# Patient Record
Sex: Female | Born: 1980 | Race: White | Hispanic: No | Marital: Married | Smoking: Former smoker
Health system: Southern US, Community
[De-identification: ages and names within clinical notes are randomized; demographics above are authoritative.]

## PROBLEM LIST (undated history)

## (undated) DIAGNOSIS — K219 Gastro-esophageal reflux disease without esophagitis: Secondary | ICD-10-CM

## (undated) DIAGNOSIS — O1490 Unspecified pre-eclampsia, unspecified trimester: Secondary | ICD-10-CM

## (undated) DIAGNOSIS — R002 Palpitations: Secondary | ICD-10-CM

## (undated) DIAGNOSIS — D126 Benign neoplasm of colon, unspecified: Secondary | ICD-10-CM

## (undated) DIAGNOSIS — M1990 Unspecified osteoarthritis, unspecified site: Secondary | ICD-10-CM

## (undated) DIAGNOSIS — K259 Gastric ulcer, unspecified as acute or chronic, without hemorrhage or perforation: Secondary | ICD-10-CM

## (undated) DIAGNOSIS — E042 Nontoxic multinodular goiter: Secondary | ICD-10-CM

## (undated) DIAGNOSIS — K760 Fatty (change of) liver, not elsewhere classified: Secondary | ICD-10-CM

## (undated) HISTORY — DX: Nontoxic multinodular goiter: E04.2

## (undated) HISTORY — DX: Unspecified osteoarthritis, unspecified site: M19.90

## (undated) HISTORY — PX: TUBAL LIGATION: SHX77

## (undated) HISTORY — PX: CHOLECYSTECTOMY: SHX55

## (undated) HISTORY — PX: LIVER BIOPSY: SHX301

## (undated) HISTORY — DX: Benign neoplasm of colon, unspecified: D12.6

## (undated) HISTORY — DX: Gastro-esophageal reflux disease without esophagitis: K21.9

## (undated) HISTORY — DX: Unspecified pre-eclampsia, unspecified trimester: O14.90

## (undated) HISTORY — DX: Palpitations: R00.2

---

## 2011-12-10 HISTORY — PX: MOUTH SURGERY: SHX715

## 2015-10-30 LAB — HEPATIC FUNCTION PANEL
ALT: 71 U/L — AB (ref 7–35)
AST: 38 U/L — AB (ref 13–35)
Alkaline Phosphatase: 85 U/L (ref 25–125)
Bilirubin, Total: 0.3 mg/dL

## 2015-10-30 LAB — CBC AND DIFFERENTIAL
HEMATOCRIT: 36 % (ref 36–46)
HEMOGLOBIN: 11.6 g/dL — AB (ref 12.0–16.0)
Neutrophils Absolute: 61 /uL
Platelets: 320 10*3/uL (ref 150–399)
WBC: 8.1 10*3/mL

## 2015-10-30 LAB — LIPID PANEL
CHOLESTEROL: 208 mg/dL — AB (ref 0–200)
HDL: 65 mg/dL (ref 35–70)
LDL CALC: 128 mg/dL
TRIGLYCERIDES: 76 mg/dL (ref 40–160)

## 2015-10-30 LAB — BASIC METABOLIC PANEL
BUN: 8 mg/dL (ref 4–21)
CREATININE: 0.6 mg/dL (ref 0.5–1.1)
Glucose: 103 mg/dL
POTASSIUM: 4 mmol/L (ref 3.4–5.3)
SODIUM: 141 mmol/L (ref 137–147)

## 2015-10-30 LAB — TSH: TSH: 1.34 u[IU]/mL (ref 0.41–5.90)

## 2015-12-22 ENCOUNTER — Encounter: Payer: Self-pay | Admitting: *Deleted

## 2015-12-22 ENCOUNTER — Emergency Department (INDEPENDENT_AMBULATORY_CARE_PROVIDER_SITE_OTHER)
Admission: EM | Admit: 2015-12-22 | Discharge: 2015-12-22 | Disposition: A | Discharge status: Home / Self Care | Payer: BLUE CROSS/BLUE SHIELD | Source: Home / Self Care | Attending: Family Medicine | Admitting: Family Medicine

## 2015-12-22 DIAGNOSIS — R112 Nausea with vomiting, unspecified: Secondary | ICD-10-CM

## 2015-12-22 DIAGNOSIS — R079 Chest pain, unspecified: Secondary | ICD-10-CM | POA: Diagnosis not present

## 2015-12-22 DIAGNOSIS — R1013 Epigastric pain: Secondary | ICD-10-CM | POA: Diagnosis not present

## 2015-12-22 HISTORY — DX: Fatty (change of) liver, not elsewhere classified: K76.0

## 2015-12-22 NOTE — ED Provider Notes (Signed)
CSN: [id number]     Arrival date & time 12/22/15  2013 History   First MD Initiated Contact with Patient 12/22/15 2026     Chief Complaint  Patient presents with  . Hypertension  . Nausea   (Consider location/radiation/quality/duration/timing/severity/associated sxs/prior Treatment) HPI  Pt is a 35yo female presenting to [facility] with c/o feeling flushed, chest feels hot, Right sided chest/rib pain, nausea and epigastric pain intermittent for 1 week. Symptoms worsened earlier today. Pt states she was standing with her daughter earlier today and felt like she was "off balance." she also reports intermittent Right hand numbness.  Around 6PM she became so nauseated she caused herself to vomit but states she did not feel any better. She did go to urgent care yesterday and had a normal EKG and was dx with sinusitis and given Augmentin.  She states symptoms are no better today.  She also reports wearing a Holter monitor for palpitations about 1 month ago.  The findings were normal.  Denies prior abdominal surgeries or heart issues. Denies SOB at this time. Pt unsure if these symptoms are due to a panic attack but she does not take any medications for blood pressure, her palpitations or anxiety.   Past Medical History  Diagnosis Date  . Fatty liver   . Esophagitis    History reviewed. No pertinent past surgical history. Family History  Problem Relation Age of Onset  . Cancer Mother     colon  . Diabetes Father   . Multiple sclerosis Sister    Social History  Substance Use Topics  . Smoking status: Never Smoker   . Smokeless tobacco: None  . Alcohol Use: No   OB History    No data available     Review of Systems  Constitutional: Positive for chills. Negative for fever.  HENT: Positive for congestion and sinus pressure. Negative for sore throat and voice change.   Respiratory: Positive for cough. Negative for shortness of breath.   Cardiovascular: Positive for chest pain (Right side) and  palpitations.  Gastrointestinal: Positive for nausea, vomiting ( self-induced), abdominal pain and diarrhea.  Musculoskeletal: Positive for myalgias and arthralgias.  Neurological: Positive for dizziness, syncope ( near-syncope) and light-headedness. Negative for headaches.    Allergies  Amoxicillin  Home Medications   Prior to Admission medications   Medication Sig Start Date End Date Taking? Authorizing Provider  amoxicillin-clavulanate (AUGMENTIN) 875-125 MG tablet Take 1 tablet by mouth 2 (two) times daily.   Yes Historical Provider, MD  omeprazole (PRILOSEC) 40 MG capsule Take 40 mg by mouth daily.   Yes Historical Provider, MD   Meds Ordered and Administered this Visit  Medications - No data to display  BP 156/92 mmHg  Pulse 87  Temp(Src) 98.2 F (36.8 C) (Oral)  Resp 18  Ht 5' 6.5" (1.689 m)  Wt 329 lb (149.233 kg)  BMI 52.31 kg/m2  SpO2 99%  LMP 12/22/2015 No data found.   Physical Exam  Constitutional: She appears well-developed and well-nourished. No distress.  HENT:  Head: Normocephalic and atraumatic.  Eyes: Conjunctivae are normal. No scleral icterus.  Neck: Normal range of motion.  Cardiovascular: Normal rate, regular rhythm and normal heart sounds.   Pulmonary/Chest: Effort normal and breath sounds normal. No respiratory distress. She has no wheezes. She has no rales. She exhibits no tenderness.  Abdominal: Soft. She exhibits no distension and no mass. There is tenderness. There is no rebound and no guarding.  Musculoskeletal: Normal range of motion.  Neurological:  She is alert.  Skin: Skin is warm and dry. She is not diaphoretic.  Nursing note and vitals reviewed.   ED Course  Procedures (including critical care time)  Labs Review Labs Reviewed - No data to display  Imaging Review No results found.    MDM  No diagnosis found.  Pt c/o multiple symptoms including near-syncope and Right sided chest pain.  Nausea and epigastric pain. Similar  symptoms yesterday at [facility] who advised her to go to emergency department if not improving.  Per pt history, pt's symptoms seem to have worsened.   EKG: WNL, however also negative precordial T-waves. No old to compare  Strongly encouraged pt to go to emergency department to r/o Cholesysitis, pancreatitis or ACS as no stat labs are available in UC setting.  Pt declined EMS transport. Will drive POV to [place], [place] 12/22/15 2048

## 2015-12-22 NOTE — ED Notes (Signed)
Pt c/o nausea, face and chest feel hot, diarrhea, RT side rib pain and epigastric pain x 1 wk. She was seen at [facility] yesterday given Augmentin for sinusitis and reports a nml EKG.

## 2015-12-25 ENCOUNTER — Telehealth: Payer: Self-pay | Admitting: Emergency Medicine

## 2015-12-26 ENCOUNTER — Ambulatory Visit (INDEPENDENT_AMBULATORY_CARE_PROVIDER_SITE_OTHER): Payer: BLUE CROSS/BLUE SHIELD | Admitting: Physician Assistant

## 2015-12-26 ENCOUNTER — Encounter: Payer: Self-pay | Admitting: Physician Assistant

## 2015-12-26 VITALS — BP 132/80 | HR 83 | Ht 66.5 in | Wt 322.0 lb

## 2015-12-26 DIAGNOSIS — K279 Peptic ulcer, site unspecified, unspecified as acute or chronic, without hemorrhage or perforation: Secondary | ICD-10-CM

## 2015-12-26 DIAGNOSIS — R10816 Epigastric abdominal tenderness: Secondary | ICD-10-CM

## 2015-12-26 MED ORDER — SUCRALFATE 1 G PO TABS: 1.0000 g | ORAL_TABLET | Freq: Four times a day (QID) | ORAL | Status: DC

## 2015-12-26 NOTE — Patient Instructions (Signed)
Peptic Ulcer ° °A peptic ulcer is a sore in the lining of your esophagus (esophageal ulcer), stomach (gastric ulcer), or in the first part of your small intestine (duodenal ulcer). The ulcer causes erosion into the deeper tissue. °CAUSES  °Normally, the lining of the stomach and the small intestine protects itself from the acid that digests food. The protective lining can be damaged by: °· An infection caused by a bacterium called Helicobacter pylori (H. pylori). °· Regular use of nonsteroidal anti-inflammatory drugs (NSAIDs), such as ibuprofen or aspirin. °· Smoking tobacco. °Other risk factors include being older than 50, drinking alcohol excessively, and having a family history of ulcer disease.  °SYMPTOMS  °· Burning pain or gnawing in the area between the chest and the belly button. °· Heartburn. °· Nausea and vomiting. °· Bloating. °The pain can be worse on an empty stomach and at night. If the ulcer results in bleeding, it can cause: °· Black, tarry stools. °· Vomiting of bright red blood. °· Vomiting of coffee-ground-looking materials. °DIAGNOSIS  °A diagnosis is usually made based upon your history and an exam. Other tests and procedures may be performed to find the cause of the ulcer. Finding a cause will help determine the best treatment. Tests and procedures may include: °· Blood tests, stool tests, or breath tests to check for the bacterium H. pylori. °· An upper gastrointestinal (GI) series of the esophagus, stomach, and small intestine. °· An endoscopy to examine the esophagus, stomach, and small intestine. °· A biopsy. °TREATMENT  °Treatment may include: °· Eliminating the cause of the ulcer, such as smoking, NSAIDs, or alcohol. °· Medicines to reduce the amount of acid in your digestive tract. °· Antibiotic medicines if the ulcer is caused by the H. pylori bacterium. °· An upper endoscopy to treat a bleeding ulcer. °· Surgery if the bleeding is severe or if the ulcer created a hole somewhere in the  digestive system. °HOME CARE INSTRUCTIONS  °· Avoid tobacco, alcohol, and caffeine. Smoking can increase the acid in the stomach, and continued smoking will impair the healing of ulcers. °· Avoid foods and drinks that seem to cause discomfort or aggravate your ulcer. °· Only take medicines as directed by your caregiver. Do not substitute over-the-counter medicines for prescription medicines without talking to your caregiver. °· Keep any follow-up appointments and tests as directed. °SEEK MEDICAL CARE IF:  °· Your do not improve within 7 days of starting treatment. °· You have ongoing indigestion or heartburn. °SEEK IMMEDIATE MEDICAL CARE IF:  °· You have sudden, sharp, or persistent abdominal pain. °· You have bloody or dark black, tarry stools. °· You vomit blood or vomit that looks like coffee grounds. °· You become light-headed, weak, or feel faint. °· You become sweaty or clammy. °MAKE SURE YOU:  °· Understand these instructions. °· Will watch your condition. °· Will get help right away if you are not doing well or get worse. °  °This information is not intended to replace advice given to you by your health care provider. Make sure you discuss any questions you have with your health care provider. °  °Document Released: 11/22/2000 Document Revised: 12/16/2014 Document Reviewed: 06/24/2012 °Elsevier Interactive Patient Education ©2016 Elsevier Inc. ° °

## 2015-12-28 ENCOUNTER — Telehealth: Payer: Self-pay | Admitting: *Deleted

## 2015-12-28 ENCOUNTER — Other Ambulatory Visit: Payer: Self-pay | Admitting: Physician Assistant

## 2015-12-28 MED ORDER — SUCRALFATE 1 GM/10ML PO SUSP: 1.0000 g | Freq: Three times a day (TID) | ORAL | Status: DC

## 2015-12-28 NOTE — Telephone Encounter (Signed)
Pt left vm stating that she is unable to swallow the carfate you gave her.  The pharmacist told her it comes in a liquid so she wants to know if you'd send that in for her.

## 2015-12-28 NOTE — Telephone Encounter (Signed)
i went ahead and sent to pharmacy.

## 2016-01-01 NOTE — Progress Notes (Signed)
   Subjective:    Patient ID: Kayla Meza, female    DOB: 11/02/1981, 35 y.o.   MRN: [id number]  HPI Pt is a 35 yo female who presents to the clinic to establish care.   .. Active Ambulatory Problems    Diagnosis Date Noted  . No Active Ambulatory Problems   Resolved Ambulatory Problems    Diagnosis Date Noted  . No Resolved Ambulatory Problems   Past Medical History  Diagnosis Date  . Fatty liver   . Esophagitis    .Marland Kitchen Family History  Problem Relation Age of Onset  . Cancer Mother     colon  . Diabetes Father   . Multiple sclerosis Sister   . Depression Sister   . Cancer Maternal Aunt   . Cancer Maternal Uncle   . Cancer Maternal Grandmother   . Alcohol abuse Maternal Grandfather   . Cancer Maternal Aunt    .Marland Kitchen Social History   Social History  . Marital Status: Married    Spouse Name: N/A  . Number of Children: N/A  . Years of Education: N/A   Occupational History  . Not on file.   Social History Main Topics  . Smoking status: Former Research scientist (life sciences)  . Smokeless tobacco: Not on file  . Alcohol Use: No  . Drug Use: No  . Sexual Activity: Yes   Other Topics Concern  . Not on file   Social History Narrative   For the last week she has had right sided chest pain, abdominal pain. She has been seen by urgent care and ER. cardaic causes were ruled out. She is still having some epigastric pain. Food sometimes relives pain. Some nausea without vomiting. No diarrhea. No melena or hematochezia. Cbc in ER was negative for any blood loss.   Review of Systems  All other systems reviewed and are negative.      Objective:   Physical Exam  Constitutional: She is oriented to person, place, and time. She appears well-developed and well-nourished.  Morbidly obese.   HENT:  Head: Normocephalic and atraumatic.  Cardiovascular: Normal rate, regular rhythm and normal heart sounds.   Pulmonary/Chest: Effort normal and breath sounds normal.  Abdominal: Soft. Bowel sounds are  normal. She exhibits no distension and no mass. There is no tenderness. There is no rebound and no guarding.  Epigastric tenderness to palpation. No rebound or guarding.   Neurological: She is alert and oriented to person, place, and time.  Psychiatric: She has a normal mood and affect. Her behavior is normal.          Assessment & Plan:  Morbid obesity- discussed with patient to continue to lose weight.   PUD/epigastric tenderness- GI cocktail given in office today. Omeprazole to start daily. carafate 4 times a day for 2 months. Follow up if symptoms worsen.

## 2016-01-03 ENCOUNTER — Encounter: Payer: Self-pay | Admitting: Physician Assistant

## 2016-01-03 DIAGNOSIS — R079 Chest pain, unspecified: Secondary | ICD-10-CM | POA: Insufficient documentation

## 2016-01-09 ENCOUNTER — Encounter: Payer: Self-pay | Admitting: Physician Assistant

## 2016-01-12 ENCOUNTER — Encounter: Payer: Self-pay | Admitting: Physician Assistant

## 2016-01-12 ENCOUNTER — Ambulatory Visit (INDEPENDENT_AMBULATORY_CARE_PROVIDER_SITE_OTHER): Payer: BLUE CROSS/BLUE SHIELD | Admitting: Physician Assistant

## 2016-01-12 VITALS — BP 134/69 | HR 80 | Ht 66.5 in | Wt 324.0 lb

## 2016-01-12 DIAGNOSIS — R748 Abnormal levels of other serum enzymes: Secondary | ICD-10-CM | POA: Diagnosis not present

## 2016-01-12 DIAGNOSIS — R1013 Epigastric pain: Secondary | ICD-10-CM | POA: Diagnosis not present

## 2016-01-12 DIAGNOSIS — R110 Nausea: Secondary | ICD-10-CM

## 2016-01-12 DIAGNOSIS — K529 Noninfective gastroenteritis and colitis, unspecified: Secondary | ICD-10-CM

## 2016-01-12 DIAGNOSIS — N898 Other specified noninflammatory disorders of vagina: Secondary | ICD-10-CM

## 2016-01-12 DIAGNOSIS — N920 Excessive and frequent menstruation with regular cycle: Secondary | ICD-10-CM | POA: Insufficient documentation

## 2016-01-12 LAB — POCT URINE PREGNANCY: Preg Test, Ur: NEGATIVE

## 2016-01-12 NOTE — Progress Notes (Signed)
   Subjective:    Patient ID: Kayla Meza, female    DOB: 1981/09/01, 35 y.o.   MRN: [id number]  HPI   Patient is a 35 year old female who presents with 3/10 constant,  non-radiating epigastric abdominal, three well formed bowel movements, and a blood clot with associated spotting for one day's duration. Patient has a past medical history relevant for suspected peptic ulcer about 2 week ago which per patient symptoms had resolved completely until yesterday. Patient states that she went to [facility] and worked out for 1.5 hours on 12/10/2015. On 12/11/2015, patient noticed epigastric abdominal pain, increased frequency of bowl movements, and a blood clot with associated spotting. Patient states that her next menstrual cycle is anticipated to start on 01/19/2016. Patient describes blood clot as small and dark red. Patient states that she has recently increased her dietary fiber intake. Patient denies abnormal consistency or color of stools. Patient has experienced nausea but denies vomiting. Patient denies dysuria, chest pain, and shortness of breath. Patient has been afebrile.  She asks about her "autoimmune hepatitis" that [facility] stated she had.   Due to nausea and unknown pregnancies on OCP in the past she would like a UPT>       Review of Systems See HPI.    Objective:   Physical Exam  Constitutional: She appears well-developed and well-nourished.  Obese.   HENT:  Head: Normocephalic and atraumatic.  Neck: Normal range of motion. Neck supple.  Cardiovascular: Normal rate, regular rhythm and normal heart sounds.   Pulmonary/Chest: Effort normal and breath sounds normal. She has no wheezes.  No CVA tenderness.  Abdominal: Soft. Bowel sounds are normal. She exhibits no distension and no mass. There is tenderness. There is no rebound and no guarding.  Epigastric tenderness on exam. No guarding or rebound.  Lymphadenopathy:    She has no cervical adenopathy.  Psychiatric: She has a  normal mood and affect. Her behavior is normal.          Assessment & Plan:  Spotting- this could be her period starting early or due to exercise and some uterine contractions. If happens again or more frequent or pain changes let us know.   Epigastric tenderness- I do not think this is ulcer pain. Her symptoms had resolved. She had a fairly strenuous work out and likely having some abdominal soreness. Discussed warm compresses and biofreeze with some rest.   Frequent stools- likely due to increased fiber intake. Not loose or hard. No treatment today only reassurance.   Elevated liver enzymes- I do see that pt has a hx of elevated enzymes. I do not have any documentation of autoimmune hepatitis. Will try to get records from [facility]. Encouraged pt to move forward with any suggestion from GI.    Pt requested. Nausea- UPT negative.

## 2016-01-16 ENCOUNTER — Encounter: Payer: Self-pay | Admitting: Physician Assistant

## 2016-01-16 ENCOUNTER — Ambulatory Visit (INDEPENDENT_AMBULATORY_CARE_PROVIDER_SITE_OTHER): Payer: BLUE CROSS/BLUE SHIELD | Admitting: Physician Assistant

## 2016-01-16 VITALS — BP 142/45 | HR 63 | Ht 66.5 in | Wt 326.0 lb

## 2016-01-16 DIAGNOSIS — K2100 Gastro-esophageal reflux disease with esophagitis, without bleeding: Secondary | ICD-10-CM

## 2016-01-16 DIAGNOSIS — F32A Depression, unspecified: Secondary | ICD-10-CM

## 2016-01-16 DIAGNOSIS — K279 Peptic ulcer, site unspecified, unspecified as acute or chronic, without hemorrhage or perforation: Secondary | ICD-10-CM

## 2016-01-16 DIAGNOSIS — M1711 Unilateral primary osteoarthritis, right knee: Secondary | ICD-10-CM | POA: Insufficient documentation

## 2016-01-16 DIAGNOSIS — F329 Major depressive disorder, single episode, unspecified: Secondary | ICD-10-CM

## 2016-01-16 DIAGNOSIS — R748 Abnormal levels of other serum enzymes: Secondary | ICD-10-CM

## 2016-01-16 DIAGNOSIS — K2950 Unspecified chronic gastritis without bleeding: Secondary | ICD-10-CM | POA: Insufficient documentation

## 2016-01-16 MED ORDER — DEXLANSOPRAZOLE 60 MG PO CPDR: 60.0000 mg | DELAYED_RELEASE_CAPSULE | Freq: Every day | ORAL | Status: DC

## 2016-01-16 NOTE — Addendum Note (Signed)
Addended byDonella Stade on: 01/16/2016 02:06 PM   Modules accepted: Orders, Medications

## 2016-01-16 NOTE — Progress Notes (Addendum)
   Subjective:    Patient ID: Kayla Meza, female    DOB: 1981-03-16, 35 y.o.   MRN: [id number]  Knee Pain   Patient is a 35 year old female that presents with non-radiating, sharp, 8/10 right knee pain restricted to the anterior aspect of the knee. Patient has experienced knee pain for the past two weeks but it has worsened in intensity over the last three days. Patient sought care in the emergency room for the pain on 01/14/2016. Patient was subsequently diagnosed with osteoarthritis and prescribed Tylenol. Patient has not tried medications in order to relieve pain. Patient has a PMH relevant for suspected peptic ulcer. Patient also presents with a sense of "chest burning" and cough productive for mucous, findings consistent with a prior diagnosis of esophagitis. Patient also states that she has experienced nausea consistent with suspected peptic ulcer. She is taking omeprazole and carafate.Patient also complains of trouble sleeping for the past two weeks. Patient denies vomiting, recent sick contacts, and fever.   Review of Systems Please see HPI    Objective:   Physical Exam  Constitutional: She is oriented to person, place, and time. She appears well-developed and well-nourished.  Patient is increasingly tearful throughout exam. Patient states that she "justs wants to be around for her kids".   HENT:  Head: Normocephalic and atraumatic.  Eyes: Conjunctivae and EOM are normal. Pupils are equal, round, and reactive to light.  Neck: Normal range of motion. Neck supple.  Pulmonary/Chest: Effort normal. She has no wheezes.  Abdominal: Soft.  Musculoskeletal: Normal range of motion.  Neurological: She is alert and oriented to person, place, and time.  Skin: Skin is warm and dry.  Psychiatric: She has a normal mood and affect. Her behavior is normal. Judgment and thought content normal.      Assessment & Plan:   1. Primary osteoarthritis of right knee:   Patient has 8/10 right knee pain  restricted to the anterior aspect of the knee. Patient is unable to take NSAIDS due to suspected peptic ulcer. Discussed a cortisone injection for possible relief of pain. Patient is opposed to an injection at this time. Patient was advised to take Tylenol to relieve pain. Patient was prescribed pennsaid gel to be applied BID. Patient was also ordered a knee brace. She did not fit into any brace in the office. Her measurements are 22inches calf and 31 inches thigh.   2. Sleep Disturbance: Patient was advised to take melatonin one hour before bed time. Discussed relaxation techniques such as a hot bath and peppermint tea before bed.   3. Chest burning and cough, likely secondary to esophagitis:  Patient was interested in receiving a GI cocktail. Administered GI cocktail in office.   4. Suspected peptic ulcer. Patient was referred to GI for possible endoscopy. Patient was advised to use dexilant and stop omeprazole and carafate to manage gastric acidity. Discussed certains foods to avoid.   5. Acute Depression.  Patient seems dysphoric, sad, and increasingly tearful. Performed a basic depression screen in office. Discussed with patient potential pharmacotherapy options for managing depression. Patient is opposed to starting a drug therapy regimen at this time.

## 2016-01-17 MED ORDER — DICLOFENAC SODIUM 2 % TD SOLN: TRANSDERMAL | Status: DC

## 2016-01-17 NOTE — Addendum Note (Signed)
Addended by: Donella Stade on: 01/17/2016 09:45 AM   Modules accepted: Orders

## 2016-01-19 ENCOUNTER — Telehealth: Payer: Self-pay | Admitting: Physician Assistant

## 2016-01-19 ENCOUNTER — Emergency Department (INDEPENDENT_AMBULATORY_CARE_PROVIDER_SITE_OTHER)
Admission: EM | Admit: 2016-01-19 | Discharge: 2016-01-19 | Disposition: A | Discharge status: Home / Self Care | Payer: BLUE CROSS/BLUE SHIELD | Source: Home / Self Care | Attending: Family Medicine | Admitting: Family Medicine

## 2016-01-19 DIAGNOSIS — R252 Cramp and spasm: Secondary | ICD-10-CM

## 2016-01-19 HISTORY — DX: Gastric ulcer, unspecified as acute or chronic, without hemorrhage or perforation: K25.9

## 2016-01-19 NOTE — ED Provider Notes (Signed)
CSN: [id number]     Arrival date & time 01/19/16  1443 History   First MD Initiated Contact with Patient 01/19/16 1514     Chief Complaint  Patient presents with  . Leg Pain    right   (Consider location/radiation/quality/duration/timing/severity/associated sxs/prior Treatment) HPI Pt is a 35yo morbidly obese female presenting to [facility] with c/o Right lower leg cramping that started 1 week ago.  She was seen in the emergency department on 01/14/16 for same.  The [place] was negative for DVT and plain films of her knee showed signs of mild DJD.  She f/u with her PCP, Iran Planas PA-C, on 01/16/16.  She was advised to take acetaminophen and robaxin she was prescribed in the emergency department as she cannot have NSAIDs due to a peptic ulcer and declined joint injection at that time.  She has been measured for a knee brace but it has not come in yet. Pt denies knee pain today.  Pt states the Right posterior lower part of her leg is cramping and she is not sure why since the emergency department said her pain was due to OA of her knee.  Denies falls or known injuries.  Pain became worse today while walking around [employer] for 1 hour.  She has been using crutches to help ambulate due to the pain.  Pain is 4/10 at worst.     Past Medical History  Diagnosis Date  . Fatty liver   . Esophagitis   . Hepatitis   . Immune deficiency disorder ([place])   . Gastric ulcer    Past Surgical History  Procedure Laterality Date  . Mouth surgery  2013   Family History  Problem Relation Age of Onset  . Cancer Mother     colon  . Diabetes Father   . Multiple sclerosis Sister   . Depression Sister   . Cancer Maternal Aunt   . Cancer Maternal Uncle   . Cancer Maternal Grandmother   . Alcohol abuse Maternal Grandfather   . Cancer Maternal Aunt    Social History  Substance Use Topics  . Smoking status: Former Research scientist (life sciences)  . Smokeless tobacco: None  . Alcohol Use: No   OB History    No data available     Review of  Systems  Constitutional: Negative for fever and chills.  Musculoskeletal: Positive for myalgias and joint swelling. Negative for arthralgias.       Right lower leg  Skin: Negative for color change and wound.  Neurological: Negative for weakness and numbness.    Allergies  Amoxicillin and Azithromycin  Home Medications   Prior to Admission medications   Medication Sig Start Date End Date Taking? Authorizing Provider  dexlansoprazole (DEXILANT) 60 MG capsule Take 1 capsule (60 mg total) by mouth daily. 01/16/16   Jade L Breeback, PA-C  Diclofenac Sodium 2 % SOLN Apply to affected area twice a day. 01/17/16   Donella Stade, PA-C  norgestrel-ethinyl estradiol (LOW-OGESTREL) 0.3-30 MG-MCG tablet TK 1 T PO  D 11/26/15   Historical Provider, MD  sucralfate (CARAFATE) 1 GM/10ML suspension Take 10 mLs (1 g total) by mouth 4 (four) times daily -  with meals and at bedtime. 12/28/15   Donella Stade, PA-C   Meds Ordered and Administered this Visit  Medications - No data to display  BP 145/84 mmHg  Pulse 80  Temp(Src) 98 F (36.7 C) (Oral)  Ht 5\' 7"  (1.702 m)  Wt 323 lb (146.512 kg)  BMI 50.58  kg/m2  SpO2 98%  LMP 01/16/2016 No data found.   Physical Exam  Constitutional: She is oriented to person, place, and time. She appears well-developed and well-nourished.  HENT:  Head: Normocephalic and atraumatic.  Eyes: EOM are normal.  Neck: Normal range of motion.  Cardiovascular: Normal rate.   Pulmonary/Chest: Effort normal.  Musculoskeletal: Normal range of motion. She exhibits tenderness. She exhibits no edema.  Bilateral obese lower extremities. No significant edema of Right leg compared to Left.  Tenderness to posterior calf. Full ROM Right knee. Non-tender.   Neurological: She is alert and oriented to person, place, and time.  Antalgic gait. Uses crutches to walk as she is hesitant to put weight on Right leg.  Skin: Skin is warm and dry. No rash noted. No erythema.  Right leg: skin  in tact, no ecchymosis, erythema, or warmth.   Psychiatric: She has a normal mood and affect. Her behavior is normal.  Nursing note and vitals reviewed.   ED Course  Procedures (including critical care time)  Labs Review Labs Reviewed - No data to display  Imaging Review No results found.      MDM   1. Cramps of right lower extremity    Pt c/o Right lower leg cramping. No recent injury. Medical records reviewed including U/S of Right lower leg performed on 01/14/16, negative for DVT.  No evidence of underlying cellulitis.  Pt has not tried anything for her pain due to concern for her Hep C with fatty liver disease as well as peptic ulcer disease.  Encouraged pt to take acetaminophen and her robaxin if needed for the pain.  Narcotics not indicated at this time.  Also discussed R.I.C.E. And she may alternate ice and heat. Encouraged pt to use her leg to help keep muscles stretched and prevent them from tightening up. F/u with PCP in 1-2 weeks if not improving. Home care instructions provided.   Noland Fordyce, PA-C 01/19/16 1530

## 2016-01-19 NOTE — Telephone Encounter (Signed)
Patient called adv that she called our office because she had not heard anything about her knee brace. Spoke to [employer] and was [employer] will research online where she can find a knee brace that will fit the pt's measurements. Patient stated she has went to a sport's medicine office behind [facility] seen a Dr. Logan Bores (P.A.) may be located off of [address] and [address]? And they have her size and wanted to know if a prescription could possibly be sent there. Pt request to know what she should do in the meantime and that it is not really her knee that is giving her the most pain it is her calf muscle area. Thanks

## 2016-01-19 NOTE — Telephone Encounter (Signed)
Ok to send rx of spider knee brace for right knee. Please include measurements in note.

## 2016-01-19 NOTE — Discharge Instructions (Signed)
Leg Cramps Leg cramps occur when a muscle or muscles tighten and you have no control over this tightening (involuntary muscle contraction). Muscle cramps can develop in any muscle, but the most common place is in the calf muscles of the leg. Those cramps can occur during exercise or when you are at rest. Leg cramps are painful, and they may last for a few seconds to a few minutes. Cramps may return several times before they finally stop. Usually, leg cramps are not caused by a serious medical problem. In many cases, the cause is not known. Some common causes include:  Overexertion.  Overuse from repetitive motions, or doing the same thing over and over.  Remaining in a certain position for a long period of time.  Improper preparation, form, or technique while performing a sport or an activity.  Dehydration.  Injury.  Side effects of some medicines.  Abnormally low levels of the salts and ions in your blood (electrolytes), especially potassium and calcium. These levels could be low if you are taking water pills (diuretics) or if you are pregnant. HOME CARE INSTRUCTIONS Watch your condition for any changes. Taking the following actions may help to lessen any discomfort that you are feeling:  Stay well-hydrated. Drink enough fluid to keep your urine clear or pale yellow.  Try massaging, stretching, and relaxing the affected muscle. Do this for several minutes at a time.  For tight or tense muscles, use a warm towel, heating pad, or hot shower water directed to the affected area.  If you are sore or have pain after a cramp, applying ice to the affected area may relieve discomfort.  Put ice in a plastic bag.  Place a towel between your skin and the bag.  Leave the ice on for 20 minutes, 2-3 times per day.  Avoid strenuous exercise for several days if you have been having frequent leg cramps.  Make sure that your diet includes the essential minerals for your muscles to work  normally.  Take medicines only as directed by your health care provider. SEEK MEDICAL CARE IF:  Your leg cramps get more severe or more frequent, or they do not improve over time.  Your foot becomes cold, numb, or blue.   This information is not intended to replace advice given to you by your health care provider. Make sure you discuss any questions you have with your health care provider.   Document Released: 01/02/2005 Document Revised: 04/11/2015 Document Reviewed: 11/02/2014 Elsevier Interactive Patient Education 2016 Elsevier Inc. Muscle Cramps and Spasms Muscle cramps and spasms occur when a muscle or muscles tighten and you have no control over this tightening (involuntary muscle contraction). They are a common problem and can develop in any muscle. The most common place is in the calf muscles of the leg. Both muscle cramps and muscle spasms are involuntary muscle contractions, but they also have differences:   Muscle cramps are sporadic and painful. They may last a few seconds to a quarter of an hour. Muscle cramps are often more forceful and last longer than muscle spasms.  Muscle spasms may or may not be painful. They may also last just a few seconds or much longer. CAUSES  It is uncommon for cramps or spasms to be due to a serious underlying problem. In many cases, the cause of cramps or spasms is unknown. Some common causes are:   Overexertion.   Overuse from repetitive motions (doing the same thing over and over).   Remaining in a  certain position for a long period of time.   Improper preparation, form, or technique while performing a sport or activity.   Dehydration.   Injury.   Side effects of some medicines.   Abnormally low levels of the salts and ions in your blood (electrolytes), especially potassium and calcium. This could happen if you are taking water pills (diuretics) or you are pregnant.  Some underlying medical problems can make it more likely to  develop cramps or spasms. These include, but are not limited to:   Diabetes.   Parkinson disease.   Hormone disorders, such as thyroid problems.   Alcohol abuse.   Diseases specific to muscles, joints, and bones.   Blood vessel disease where not enough blood is getting to the muscles.  HOME CARE INSTRUCTIONS   Stay well hydrated. Drink enough water and fluids to keep your urine clear or pale yellow.  It may be helpful to massage, stretch, and relax the affected muscle.  For tight or tense muscles, use a warm towel, heating pad, or hot shower water directed to the affected area.  If you are sore or have pain after a cramp or spasm, applying ice to the affected area may relieve discomfort.  Put ice in a plastic bag.  Place a towel between your skin and the bag.  Leave the ice on for 15-20 minutes, 03-04 times a day.  Medicines used to treat a known cause of cramps or spasms may help reduce their frequency or severity. Only take over-the-counter or prescription medicines as directed by your caregiver. SEEK MEDICAL CARE IF:  Your cramps or spasms get more severe, more frequent, or do not improve over time.  MAKE SURE YOU:   Understand these instructions.  Will watch your condition.  Will get help right away if you are not doing well or get worse.   This information is not intended to replace advice given to you by your health care provider. Make sure you discuss any questions you have with your health care provider.   Document Released: 05/17/2002 Document Revised: 03/22/2013 Document Reviewed: 11/11/2012 Elsevier Interactive Patient Education [employer].

## 2016-01-19 NOTE — ED Notes (Signed)
Started Saturday 2/4 with cramping and pain in the right leg.  Has a history of a hematoma in the leg from 8/15.  Sunday worse from upper knee all the way down the leg.  Went to [facility] last weekend and had [place] of leg and it was determined that there was no evidence of blood clot per patient.  She is having pulling and cramping pain isolated to the right lateral calf and knee.  Walked around [place] for an hour today, and became much worse.  Presented on crutches that she uses when the pain is bad.

## 2016-01-23 ENCOUNTER — Ambulatory Visit: Payer: BLUE CROSS/BLUE SHIELD | Admitting: Physician Assistant

## 2016-02-07 ENCOUNTER — Encounter: Payer: Self-pay | Admitting: Physician Assistant

## 2016-02-07 ENCOUNTER — Ambulatory Visit (INDEPENDENT_AMBULATORY_CARE_PROVIDER_SITE_OTHER): Payer: BLUE CROSS/BLUE SHIELD | Admitting: Physician Assistant

## 2016-02-07 VITALS — BP 138/76 | HR 65 | Ht 67.0 in | Wt 307.0 lb

## 2016-02-07 DIAGNOSIS — N921 Excessive and frequent menstruation with irregular cycle: Secondary | ICD-10-CM | POA: Diagnosis not present

## 2016-02-07 DIAGNOSIS — N946 Dysmenorrhea, unspecified: Secondary | ICD-10-CM | POA: Diagnosis not present

## 2016-02-07 NOTE — Progress Notes (Signed)
   Subjective:    Patient ID: Kayla Meza, female    DOB: 12-Jun-1981, 35 y.o.   MRN: [id number]  HPI  Pt is a 35 yo female who presents to the clinic with breakthrough bleeding on OCP. She has been on this OCP for 3 years. She had period that started 2 days before it was supposed to on regular pill pack and stopped. Then 2 weeks later, in middle of pill pack, she started bleeding again. She has now been bleeding for 5 days. She has continued to take pills and denies missing any pills. She has GYN who wants her off OCP at 35 due to weight and elevated of BP. She does not smoke. She had some mild abdominal cramping.   Review of Systems    see HPI.  Objective:   Physical Exam  Constitutional: She is oriented to person, place, and time. She appears well-developed and well-nourished.  Obese.   HENT:  Head: Normocephalic and atraumatic.  Cardiovascular: Normal rate, regular rhythm and normal heart sounds.   Pulmonary/Chest: Effort normal and breath sounds normal.  No CVA tenderness.   Abdominal: Soft. Bowel sounds are normal. She exhibits no distension. There is no tenderness. There is no rebound and no guarding.  Neurological: She is alert and oriented to person, place, and time.  Psychiatric: She has a normal mood and affect. Her behavior is normal.          Assessment & Plan:  Breakthrough bleeding on OCP- continue OCP at this time. Due to cramping and bleeding pt request Pelvic u/s to evaluate. Ordered. Discussion about just going ahead and stopping since will be 35 in may. She is interested in [place] since she needs something to prevent pregnancy. If her periods do not regulate or has fibroids she would be interested in ablation etc. Ibuprofen suggested for cramping.

## 2016-02-08 DIAGNOSIS — N946 Dysmenorrhea, unspecified: Secondary | ICD-10-CM | POA: Insufficient documentation

## 2016-02-08 DIAGNOSIS — N921 Excessive and frequent menstruation with irregular cycle: Secondary | ICD-10-CM | POA: Insufficient documentation

## 2016-02-09 ENCOUNTER — Ambulatory Visit (INDEPENDENT_AMBULATORY_CARE_PROVIDER_SITE_OTHER): Payer: BLUE CROSS/BLUE SHIELD

## 2016-02-09 ENCOUNTER — Encounter: Payer: Self-pay | Admitting: Physician Assistant

## 2016-02-09 ENCOUNTER — Encounter (INDEPENDENT_AMBULATORY_CARE_PROVIDER_SITE_OTHER): Payer: Self-pay

## 2016-02-09 DIAGNOSIS — N921 Excessive and frequent menstruation with irregular cycle: Secondary | ICD-10-CM | POA: Diagnosis not present

## 2016-02-09 DIAGNOSIS — D259 Leiomyoma of uterus, unspecified: Secondary | ICD-10-CM

## 2016-02-09 DIAGNOSIS — N946 Dysmenorrhea, unspecified: Secondary | ICD-10-CM | POA: Diagnosis not present

## 2016-02-16 DIAGNOSIS — D126 Benign neoplasm of colon, unspecified: Secondary | ICD-10-CM

## 2016-02-16 HISTORY — DX: Benign neoplasm of colon, unspecified: D12.6

## 2016-02-19 ENCOUNTER — Ambulatory Visit (INDEPENDENT_AMBULATORY_CARE_PROVIDER_SITE_OTHER): Payer: BLUE CROSS/BLUE SHIELD | Admitting: Physician Assistant

## 2016-02-19 ENCOUNTER — Encounter: Payer: Self-pay | Admitting: Physician Assistant

## 2016-02-19 VITALS — BP 128/60 | HR 47 | Ht 67.0 in | Wt 315.0 lb

## 2016-02-19 DIAGNOSIS — R748 Abnormal levels of other serum enzymes: Secondary | ICD-10-CM

## 2016-02-19 DIAGNOSIS — M79671 Pain in right foot: Secondary | ICD-10-CM | POA: Diagnosis not present

## 2016-02-19 DIAGNOSIS — M545 Low back pain: Secondary | ICD-10-CM

## 2016-02-19 DIAGNOSIS — R8290 Unspecified abnormal findings in urine: Secondary | ICD-10-CM

## 2016-02-19 DIAGNOSIS — M79673 Pain in unspecified foot: Secondary | ICD-10-CM | POA: Insufficient documentation

## 2016-02-19 DIAGNOSIS — R319 Hematuria, unspecified: Secondary | ICD-10-CM

## 2016-02-19 LAB — POCT URINALYSIS DIPSTICK
GLUCOSE UA: NEGATIVE
Ketones, UA: NEGATIVE
LEUKOCYTES UA: NEGATIVE
NITRITE UA: NEGATIVE
PH UA: 6.5
Protein, UA: 30
Spec Grav, UA: >=1.03
Urobilinogen, UA: 1

## 2016-02-19 MED ORDER — NITROFURANTOIN MONOHYD MACRO 100 MG PO CAPS: 100.0000 mg | ORAL_CAPSULE | Freq: Two times a day (BID) | ORAL | Status: DC

## 2016-02-19 NOTE — Progress Notes (Signed)
   Subjective:    Patient ID: Kayla Meza, female    DOB: 10-13-1981, 35 y.o.   MRN: [id number]  HPI  Pt is a 35 yo female who presents to the clinic to follow up on previous UTI and low back pain. She went to her GYN to evaluate her DUB due to fibroids. She was dx with UTI. She continues to have low back pain mostly left sided. She was given cipro but felt like increased heart rate and did not take cipro as directed and did not finish. She wonders if she still has UTI.    She just had colonoscopy and endoscopy on Friday by [facility]. She is concerned about her elevated liver enzymes and possible autoimmune hepatitis.   She does complain about right heel pain for last few months. She has not done anything to make better. Worse pain first thing in morning when she starts to bear weight.   Review of Systems  All other systems reviewed and are negative.      Objective:   Physical Exam  Constitutional: She is oriented to person, place, and time. She appears well-developed and well-nourished.  Obese.   HENT:  Head: Normocephalic and atraumatic.  Cardiovascular: Normal rate, regular rhythm and normal heart sounds.   Pulmonary/Chest: Effort normal and breath sounds normal.  No CVA tenderness.   Abdominal: Soft. Bowel sounds are normal. She exhibits no distension and no mass. There is no tenderness. There is no rebound and no guarding.  Musculoskeletal:  Tenderness over right heel to palpation.  No pain with plantar or dorsiflexion.  No pain over fascia.  Normal ROm of right foot and ankle.   Neurological: She is alert and oriented to person, place, and time.  Psychiatric: She has a normal mood and affect. Her behavior is normal.          Assessment & Plan:  Low back pain/hematuria/abnormal urinalysis- .. Results for orders placed or performed in visit on 02/19/16  POCT urinalysis dipstick  Result Value Ref Range   Color, UA orange    Clarity, UA slightly cloudy    Glucose, UA neg    Bilirubin, UA sma    Ketones, UA neg    Spec Grav, UA >=1.030    Blood, UA trace-intact    pH, UA 6.5    Protein, UA 30    Urobilinogen, UA 1.0    Nitrite, UA neg    Leukocytes, UA Negative Negative   Since this is only symptom pt had with last UTI will treat with macrobid for 7 days. Will culture and call if no infection. Pt will need to get repeat urinalysis after abx completion or after normal culture.   Elevated liver enzymes- will recheck to make sure no changes. 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 [facility] discuss with them. We are trying to get records from last GI doctor in [place]. Next step may be liver biopsy.  Right heel pain- suspect to be heel spur and possible to cause some plantar fascittis. Needs orthotics and possible injection. Follow up with sports med. Given HO. Discussing icing and topical NSAIDS due to recent gastritis episode most likely due to increased NSAID intake. Stay away from oral NSAIDs.

## 2016-02-19 NOTE — Patient Instructions (Addendum)
Heel Spur  A heel spur is a bony growth that forms on the bottom of your heel bone (calcaneus). Heel spurs are common and do not always cause pain. However, heel spurs often cause inflammation in the strong band of tissue that runs underneath the bone of your foot (plantar fascia). When this happens, you may feel pain on the bottom of your foot, near your heel.   CAUSES   The cause of heel spurs is not completely understood. They may be caused by pressure on the heel. Or, they may stem from the muscle attachments (tendons) near the spur pulling on the heel.   RISK FACTORS  You may be at risk for a heel spur if you:  · Are older than 40.  · Are overweight.  · Have wear and tear arthritis (osteoarthritis).  · Have plantar fascia inflammation.  SIGNS AND SYMPTOMS   Some people have heel spurs but no symptoms. If you do have symptoms, they may include:   · Pain in the bottom of your heel.  · Pain that is worse when you first get out of bed.  · Pain that gets worse after walking or standing.  DIAGNOSIS   Your health care provider may diagnose a heel spur based on your symptoms and a physical exam. You may also have an X-ray of your foot to check for a bony growth coming from the calcaneus.   TREATMENT  Treatment aims to relieve the pain from the heel spur. This may include:  · Stretching exercises.  · Losing weight.  · Wearing specific shoes, inserts, or orthotics for comfort and support.  · Wearing splints at night to properly position your feet.  · Taking over-the-counter medicine to relieve pain.  · Being treated with high-intensity sound waves to break up the heel spur (extracorporeal shock wave therapy).  · Getting steroid injections in your heel to reduce swelling and ease pain.  · Having surgery if your heel spur causes long-term (chronic) pain.  HOME CARE INSTRUCTIONS   · Take medicines only as directed by your health care provider.  · Ask your health care provider if you should use ice or cold packs on the  painful areas of your heel or foot.  · Avoid activities that cause you pain until you recover or as directed by your health care provider.  · Stretch before exercising or being physically active.  · Wear supportive shoes that fit well as directed by your health care provider. You might need to buy new shoes. Wearing old shoes or shoes that do not fit correctly may not provide the support that you need.  · Lose weight if your health care provider thinks you should. This can relieve pressure on your foot that may be causing pain and discomfort.  SEEK MEDICAL CARE IF:   · Your pain continues or gets worse.     This information is not intended to replace advice given to you by your health care provider. Make sure you discuss any questions you have with your health care provider.     Document Released: 01/01/2006 Document Revised: 12/16/2014 Document Reviewed: 01/26/2014  Elsevier Interactive Patient Education ©2016 Elsevier Inc.

## 2016-02-21 LAB — URINE CULTURE
Colony Count: NO GROWTH
ORGANISM ID, BACTERIA: NO GROWTH

## 2016-02-23 ENCOUNTER — Encounter: Payer: Self-pay | Admitting: Physician Assistant

## 2016-02-23 DIAGNOSIS — D126 Benign neoplasm of colon, unspecified: Secondary | ICD-10-CM | POA: Insufficient documentation

## 2016-02-26 ENCOUNTER — Encounter: Payer: Self-pay | Admitting: Family Medicine

## 2016-02-26 ENCOUNTER — Ambulatory Visit (INDEPENDENT_AMBULATORY_CARE_PROVIDER_SITE_OTHER): Payer: BLUE CROSS/BLUE SHIELD

## 2016-02-26 ENCOUNTER — Ambulatory Visit (INDEPENDENT_AMBULATORY_CARE_PROVIDER_SITE_OTHER): Payer: BLUE CROSS/BLUE SHIELD | Admitting: Family Medicine

## 2016-02-26 VITALS — BP 136/85 | HR 91 | Wt 318.0 lb

## 2016-02-26 DIAGNOSIS — M79672 Pain in left foot: Secondary | ICD-10-CM

## 2016-02-26 DIAGNOSIS — M25552 Pain in left hip: Secondary | ICD-10-CM | POA: Diagnosis not present

## 2016-02-26 DIAGNOSIS — M25559 Pain in unspecified hip: Secondary | ICD-10-CM

## 2016-02-26 DIAGNOSIS — M25569 Pain in unspecified knee: Secondary | ICD-10-CM | POA: Insufficient documentation

## 2016-02-26 DIAGNOSIS — M25551 Pain in right hip: Secondary | ICD-10-CM

## 2016-02-26 DIAGNOSIS — M25562 Pain in left knee: Secondary | ICD-10-CM

## 2016-02-26 DIAGNOSIS — M25561 Pain in right knee: Secondary | ICD-10-CM

## 2016-02-26 DIAGNOSIS — M797 Fibromyalgia: Secondary | ICD-10-CM | POA: Diagnosis not present

## 2016-02-26 NOTE — Assessment & Plan Note (Signed)
Patient likely meets criteria for fibromyalgia. Discussed options. Recommend Cymbalta. Patient is reluctant to start this medicine today. She'll do some research and return in one month.

## 2016-02-26 NOTE — Assessment & Plan Note (Signed)
Plantar fasciitis. Treat with eccentric exercises and heel cushion. Return in one month consider orthotics

## 2016-02-26 NOTE — Progress Notes (Signed)
Subjective:    I'm seeing this patient as a consultation for:  Iran Planas PA  CC: Left knee and left foot pain  HPI: Patient notes left knee pain and bilateral left worse than right foot pain. Symptoms present for months without injury. Knee pain is predominant shunt at the medial and anterior portion of the knee. Foot pain is plantar calcaneus. Patient's PCP suspects plantar fasciitis. She's had a trial of meloxicam and prednisone that did not help. Patient additionally notes bilateral hip pain. Pain is located in the anterior aspect of her pelvis and is worse with activity and better with rest. No abdominal pain vomiting or diarrhea. Patient edition his whole-body soreness and is worried she may have fibromyalgia. Patient notes that for pain control overall she cannot tolerate NSAIDs due to stomach upset and peptic ulcer disease. Additionally she cannot tolerate Tylenol due to elevated liver enzymes. She's reluctant to take opiates for pain control.  Past medical history, Surgical history, Family history not pertinant except as noted below, Social history, Allergies, and medications have been entered into the medical record, reviewed, and no changes needed.   Review of Systems: No headache, visual changes, nausea, vomiting, diarrhea, constipation, dizziness, abdominal pain, skin rash, fevers, chills, night sweats, weight loss, swollen lymph nodes, body aches, joint swelling, muscle aches, chest pain, shortness of breath, mood changes, visual or auditory hallucinations.   Objective:    Filed Vitals:   02/26/16 0952  BP: 136/85  Pulse: 91   General: Well Developed, well nourished, and in no acute distress.  Neuro/Psych: Alert and oriented x3, extra-ocular muscles intact, able to move all 4 extremities, sensation grossly intact. Skin: Warm and dry, no rashes noted.  Respiratory: Not using accessory muscles, speaking in full sentences, trachea midline.  Cardiovascular: Pulses palpable, no  extremity edema. Abdomen: Does not appear distended. MSK: Hips normal-appearing nontender normal motion. Slight pain with internal rotation. No clicking or popping with motion. Knees normal-appearing bilaterally without significant effusion. Tender palpation medial joint line left normal right. Stable ligamentous exam. Positive medial McMurray's test left negative right.  Feet bilaterally have pes planus with pronation. Tender palpation bilateral plantar calcaneus. Pulses capillary refill sensation intact History this or to touch multiple different spots in her mid muscle bellies including thighs upper arms trapezius  No results found for this or any previous visit (from the past 24 hour(s)). Dg Pelvis 1-2 Views  02/26/2016  CLINICAL DATA:  Bilateral hip pain for 3 years.  No injury. EXAM: PELVIS - 1-2 VIEW COMPARISON:  None. FINDINGS: No acute fracture. No dislocation. Unremarkable soft tissues. Minimal degenerative change of the hip joints. Moderate degenerative change of the right SI joint. Chronic changes at the pubic symphysis. Minimal degenerative change of the left hip joint. IMPRESSION: No acute bony pathology. Electronically Signed   By: Marybelle Killings M.D.   On: 02/26/2016 11:57   Dg Knee 1-2 Views Right  02/26/2016  CLINICAL DATA:  Left knee pain EXAM: RIGHT KNEE - 1-2 VIEW COMPARISON:  None. FINDINGS: No acute fracture. No dislocation. Moderate tricompartment osteoarthritic change. IMPRESSION: No acute bony pathology.  Degenerative change. Electronically Signed   By: Marybelle Killings M.D.   On: 02/26/2016 11:53   Dg Knee Complete 4 Views Left  02/26/2016  CLINICAL DATA:  Pain.  No known injury.  Initial evaluation EXAM: LEFT KNEE - COMPLETE 4+ VIEW COMPARISON:  No prior. FINDINGS: Degenerative changes both knees. No acute bony or joint abnormality identified. No evidence fracture or dislocation.  IMPRESSION: Diffuse degenerative change.  No acute abnormality. Electronically Signed   By: [place]   On: 02/26/2016 11:54    Impression and Recommendations:   This case required medical decision making of moderate complexity.

## 2016-02-26 NOTE — Progress Notes (Signed)
Quick Note:  Arthritis is present ______

## 2016-02-26 NOTE — Assessment & Plan Note (Signed)
Unclear etiology of pelvic pain. Recommend follow-up with PCP doubtful musculoskeletal etiology.

## 2016-02-26 NOTE — Assessment & Plan Note (Signed)
Knee pain unclear etiology suspected degenerative meniscus tear. Discussed options. Patient elects for watchful waiting. Return in one month

## 2016-02-26 NOTE — Patient Instructions (Signed)
Thank you for coming in today. Use heel cushions.  Do the calf exercises. Remember to go down slowly.  Do Ice massage.  Get xrays today.  Return in 2-4 weeks.  Research cymbalta.   Duloxetine delayed-release capsules What is this medicine? DULOXETINE (doo LOX e teen) is used to treat depression, anxiety, and different types of chronic pain. This medicine may be used for other purposes; ask your health care provider or pharmacist if you have questions. What should I tell my health care provider before I take this medicine? They need to know if you have any of these conditions: -bipolar disorder or a family history of bipolar disorder -glaucoma -kidney disease -liver disease -suicidal thoughts or a previous suicide attempt -taken medicines called MAOIs like Carbex, Eldepryl, Marplan, Nardil, and Parnate within 14 days -an unusual reaction to duloxetine, other medicines, foods, dyes, or preservatives -pregnant or trying to get pregnant -breast-feeding How should I use this medicine? Take this medicine by mouth with a glass of water. Follow the directions on the prescription label. Do not cut, crush or chew this medicine. You can take this medicine with or without food. Take your medicine at regular intervals. Do not take your medicine more often than directed. Do not stop taking this medicine suddenly except upon the advice of your doctor. Stopping this medicine too quickly may cause serious side effects or your condition may worsen. A special MedGuide will be given to you by the pharmacist with each prescription and refill. Be sure to read this information carefully each time. Talk to your pediatrician regarding the use of this medicine in children. While this drug may be prescribed for children as young as 73 years of age for selected conditions, precautions do apply. Overdosage: If you think you have taken too much of this medicine contact a poison control center or emergency room at  once. NOTE: This medicine is only for you. Do not share this medicine with others. What if I miss a dose? If you miss a dose, take it as soon as you can. If it is almost time for your next dose, take only that dose. Do not take double or extra doses. What may interact with this medicine? Do not take this medicine with any of the following medications: -certain diet drugs like dexfenfluramine, fenfluramine -desvenlafaxine -linezolid -MAOIs like Azilect, Carbex, Eldepryl, Marplan, Nardil, and Parnate -methylene blue (intravenous) -milnacipran -thioridazine -venlafaxine This medicine may also interact with the following medications: -alcohol -aspirin and aspirin-like medicines -certain antibiotics like ciprofloxacin and enoxacin -certain medicines for blood pressure, heart disease, irregular heart beat -certain medicines for depression, anxiety, or psychotic disturbances -certain medicines for migraine headache like almotriptan, eletriptan, frovatriptan, naratriptan, rizatriptan, sumatriptan, zolmitriptan -certain medicines that treat or prevent blood clots like warfarin, enoxaparin, and dalteparin -cimetidine -fentanyl -lithium -NSAIDS, medicines for pain and inflammation, like ibuprofen or naproxen -phentermine -procarbazine -sibutramine -St. John's wort -theophylline -tramadol -tryptophan This list may not describe all possible interactions. Give your health care provider a list of all the medicines, herbs, non-prescription drugs, or dietary supplements you use. Also tell them if you smoke, drink alcohol, or use illegal drugs. Some items may interact with your medicine. What should I watch for while using this medicine? Tell your doctor if your symptoms do not get better or if they get worse. Visit your doctor or health care professional for regular checks on your progress. Because it may take several weeks to see the full effects of this medicine, it is important  to continue your  treatment as prescribed by your doctor. Patients and their families should watch out for new or worsening thoughts of suicide or depression. Also watch out for sudden changes in feelings such as feeling anxious, agitated, panicky, irritable, hostile, aggressive, impulsive, severely restless, overly excited and hyperactive, or not being able to sleep. If this happens, especially at the beginning of treatment or after a change in dose, call your health care professional. Dennis Bast may get drowsy or dizzy. Do not drive, use machinery, or do anything that needs mental alertness until you know how this medicine affects you. Do not stand or sit up quickly, especially if you are an older patient. This reduces the risk of dizzy or fainting spells. Alcohol may interfere with the effect of this medicine. Avoid alcoholic drinks. This medicine can cause an increase in blood pressure. This medicine can also cause a sudden drop in your blood pressure, which may make you feel faint and increase the chance of a fall. These effects are most common when you first start the medicine or when the dose is increased, or during use of other medicines that can cause a sudden drop in blood pressure. Check with your doctor for instructions on monitoring your blood pressure while taking this medicine. Your mouth may get dry. Chewing sugarless gum or sucking hard candy, and drinking plenty of water may help. Contact your doctor if the problem does not go away or is severe. What side effects may I notice from receiving this medicine? Side effects that you should report to your doctor or health care professional as soon as possible: -allergic reactions like skin rash, itching or hives, swelling of the face, lips, or tongue -changes in blood pressure -confusion -dark urine -dizziness -fast talking and excited feelings or actions that are out of control -fast, irregular heartbeat -fever -general ill feeling or flu-like  symptoms -hallucination, loss of contact with reality -light-colored stools -loss of balance or coordination -redness, blistering, peeling or loosening of the skin, including inside the mouth -right upper belly pain -seizures -suicidal thoughts or other mood changes -trouble concentrating -trouble passing urine or change in the amount of urine -unusual bleeding or bruising -unusually weak or tired -yellowing of the eyes or skin Side effects that usually do not require medical attention (report to your doctor or health care professional if they continue or are bothersome): -blurred vision -change in appetite -change in sex drive or performance -headache -increased sweating -nausea This list may not describe all possible side effects. Call your doctor for medical advice about side effects. You may report side effects to FDA at 1-800-FDA-1088. Where should I keep my medicine? Keep out of the reach of children. Store at room temperature between 20 and 25 degrees C (68 to 77 degrees F). Throw away any unused medicine after the expiration date. NOTE: This sheet is a summary. It may not cover all possible information. If you have questions about this medicine, talk to your doctor, pharmacist, or health care provider.    2016, Elsevier/Gold Standard. (2013-11-16 IX:[id number])

## 2016-02-27 LAB — HEPATIC FUNCTION PANEL
ALT: 51 U/L — AB (ref 6–29)
AST: 17 U/L (ref 10–30)
Albumin: 3.9 g/dL (ref 3.6–5.1)
Alkaline Phosphatase: 64 U/L (ref 33–115)
Bilirubin, Direct: 0.1 mg/dL (ref <=0.2)
Indirect Bilirubin: 0.3 mg/dL (ref 0.2–1.2)
TOTAL PROTEIN: 7 g/dL (ref 6.1–8.1)
Total Bilirubin: 0.4 mg/dL (ref 0.2–1.2)

## 2016-02-29 ENCOUNTER — Ambulatory Visit: Payer: BLUE CROSS/BLUE SHIELD

## 2016-03-06 ENCOUNTER — Ambulatory Visit (INDEPENDENT_AMBULATORY_CARE_PROVIDER_SITE_OTHER): Payer: BLUE CROSS/BLUE SHIELD | Admitting: Physician Assistant

## 2016-03-06 VITALS — BP 144/75 | HR 64 | Temp 97.9°F | Wt 310.0 lb

## 2016-03-06 DIAGNOSIS — R109 Unspecified abdominal pain: Secondary | ICD-10-CM | POA: Diagnosis not present

## 2016-03-06 DIAGNOSIS — N390 Urinary tract infection, site not specified: Secondary | ICD-10-CM | POA: Diagnosis not present

## 2016-03-06 DIAGNOSIS — R82998 Other abnormal findings in urine: Secondary | ICD-10-CM

## 2016-03-06 LAB — POCT URINALYSIS DIPSTICK
Bilirubin, UA: NEGATIVE
GLUCOSE UA: NEGATIVE
Ketones, UA: NEGATIVE
NITRITE UA: NEGATIVE
Protein, UA: NEGATIVE
RBC UA: NEGATIVE
Spec Grav, UA: 1.015
UROBILINOGEN UA: 0.2
pH, UA: 6.5

## 2016-03-06 NOTE — Progress Notes (Signed)
Patient came into clinic today for nurse visit to recheck her urine. Pt states her symptoms are better but does still have some flank pain. Unsure if that is related to the recent UTI. Pt was able to provide a sample in office today, will run urine and give results to PCP. Pt advised we would contact her if there were any changes/recommendations. Verbalized understanding.   .. Results for orders placed or performed in visit on 03/06/16  POCT Urinalysis Dipstick  Result Value Ref Range   Color, UA yellow    Clarity, UA clear    Glucose, UA neg    Bilirubin, UA neg    Ketones, UA neg    Spec Grav, UA 1.015    Blood, UA neg    pH, UA 6.5    Protein, UA neg    Urobilinogen, UA 0.2    Nitrite, UA neg    Leukocytes, UA Trace (A) Negative   Trace leuks. Need to culture. Urobilinogen, protein, blood, bilirubin all cleared. Will call with culture results.

## 2016-03-06 NOTE — Progress Notes (Signed)
Pt advised.

## 2016-03-06 NOTE — Progress Notes (Signed)
Urine culture added

## 2016-03-07 ENCOUNTER — Encounter: Payer: Self-pay | Admitting: Osteopathic Medicine

## 2016-03-07 ENCOUNTER — Ambulatory Visit (INDEPENDENT_AMBULATORY_CARE_PROVIDER_SITE_OTHER): Payer: BLUE CROSS/BLUE SHIELD | Admitting: Osteopathic Medicine

## 2016-03-07 VITALS — BP 146/86 | HR 83 | Ht 67.0 in | Wt 311.0 lb

## 2016-03-07 DIAGNOSIS — N898 Other specified noninflammatory disorders of vagina: Secondary | ICD-10-CM | POA: Diagnosis not present

## 2016-03-07 DIAGNOSIS — N390 Urinary tract infection, site not specified: Secondary | ICD-10-CM

## 2016-03-07 DIAGNOSIS — R82998 Other abnormal findings in urine: Secondary | ICD-10-CM

## 2016-03-07 NOTE — Patient Instructions (Signed)
Avoid sex until viral culture results are back. Likely this is a folliculitis (infectio of the hair follice) likely due to shaving. If worse, let us know or contact your OBGYN.

## 2016-03-07 NOTE — Progress Notes (Signed)
HPI: Kayla Meza is a 35 y.o. female who presents to [facility] today for chief complaint of:  Chief Complaint  Patient presents with  . vaginal skin complaint    . Location: bumps on vagina, 2 on upper R and one a it further down, last night drained some pus. Other side on L small black dots, not draining anything, like tiny blackheads . Quality: see above. No burning or pain.  . Duration: noticed yesterday . Context: Pt was here yesterday for repeat UA, (+) Leuks, awaiting Cx results.  . Assoc signs/symptoms: no abnormal vaginal bleed/discharge   Past medical, social and family history reviewed: Past Medical History  Diagnosis Date  . Fatty liver   . Esophagitis   . Hepatitis   . Immune deficiency disorder ([place])   . Gastric ulcer    Past Surgical History  Procedure Laterality Date  . Mouth surgery  2013   Social History  Substance Use Topics  . Smoking status: Former Research scientist (life sciences)  . Smokeless tobacco: Not on file  . Alcohol Use: No   Family History  Problem Relation Age of Onset  . Cancer Mother     colon  . Diabetes Father   . Multiple sclerosis Sister   . Depression Sister   . Cancer Maternal Aunt   . Cancer Maternal Uncle   . Cancer Maternal Grandmother   . Alcohol abuse Maternal Grandfather   . Cancer Maternal Aunt     Current Outpatient Prescriptions  Medication Sig Dispense Refill  . dexlansoprazole (DEXILANT) 60 MG capsule Take 1 capsule (60 mg total) by mouth daily. 30 capsule 2  . norgestrel-ethinyl estradiol (LOW-OGESTREL) 0.3-30 MG-MCG tablet TK 1 T PO  D    . sucralfate (CARAFATE) 1 g tablet Take 1 g by mouth 4 (four) times daily -  with meals and at bedtime.     No current facility-administered medications for this visit.   Allergies  Allergen Reactions  . Amoxicillin     Make feel tightness of throat and diarrhea and vomiting.   . Azithromycin     Nausea and vomiting.       Review of  Systems: CONSTITUTIONAL:  No  fever, no chills, No  unintentional weight changes CARDIAC: No  chest pain, GASTROINTESTINAL: No  nausea, No  vomiting, No  abdominal pain,  GENITOURINARY: No  incontinence, No  abnormal genital bleeding/discharge, no Hx HSV outbreaks SKIN: (+) rash/wounds/concerning lesions HEM/ONC: No  easy bruising/bleeding, No  abnormal lymph node  Exam:  BP 146/86 mmHg  Pulse 83  Ht 5\' 7"  (1.702 m)  Wt 311 lb (141.069 kg)  BMI 48.70 kg/m2  LMP 02/26/2016 Constitutional: VS see above. General Appearance: alert, well-developed, obese, NAD Skin: warm, dry, intact. 2 small raised areas with small central shallow ulceration with mild erythema on right labia majora, no purulent drainage, unable to visualize or palpate the third lesion patient states that she could feel, on left labia majora, patient describes small black dot, on my view these are consistent with superficial vein/varicosities, no rash, ulcer, drainage.. No concerning nevi or subq nodules on limited exam.   Psychiatric: Normal judgment/insight. Normal mood and affect. Oriented x3.    Results for orders placed or performed in visit on 03/06/16 (from the past 72 hour(s))  POCT Urinalysis Dipstick     Status: Abnormal   Collection Time: 03/06/16 10:13 AM  Result Value Ref Range   Color, UA yellow    Clarity, UA clear  Glucose, UA neg    Bilirubin, UA neg    Ketones, UA neg    Spec Grav, UA 1.015    Blood, UA neg    pH, UA 6.5    Protein, UA neg    Urobilinogen, UA 0.2    Nitrite, UA neg    Leukocytes, UA Trace (A) Negative    ASSESSMENT/PLAN: Symptoms are not consistent with HSV outbreak and no history of HSV however will get viral culture for confirmation. More likely folliculitis, patient advised stop shaving down there, I don't think this is severe enough to warrant antibiotics, no purulent drainage now, ulcers are fairly shallow, just keep the area clean and dry, if worse would consider by mouth  antibiotics or follow-up with her OB/GYN.   Vaginal lesion - Plan: Viral culture   Return if symptoms worsen or fail to improve.

## 2016-03-08 LAB — URINE CULTURE: Colony Count: 2000

## 2016-03-11 NOTE — Addendum Note (Signed)
Addended by: Donella Stade on: 03/11/2016 11:00 AM   Modules accepted: Orders

## 2016-03-12 ENCOUNTER — Ambulatory Visit (INDEPENDENT_AMBULATORY_CARE_PROVIDER_SITE_OTHER): Payer: BLUE CROSS/BLUE SHIELD

## 2016-03-12 DIAGNOSIS — R109 Unspecified abdominal pain: Secondary | ICD-10-CM | POA: Diagnosis not present

## 2016-03-13 LAB — VIRAL CULTURE VIRC

## 2016-03-15 ENCOUNTER — Ambulatory Visit (INDEPENDENT_AMBULATORY_CARE_PROVIDER_SITE_OTHER): Payer: BLUE CROSS/BLUE SHIELD | Admitting: Family Medicine

## 2016-03-15 ENCOUNTER — Encounter: Payer: Self-pay | Admitting: Family Medicine

## 2016-03-15 VITALS — BP 165/73 | HR 76 | Wt 305.0 lb

## 2016-03-15 DIAGNOSIS — M79672 Pain in left foot: Secondary | ICD-10-CM

## 2016-03-15 NOTE — Progress Notes (Signed)
       Kayla Meza is a 35 y.o. female who presents to [facility] today for follow-up plantar fasciitis. Patient notes continued bilateral plantar fasciitis. In the interim she has purchased some over-the-counter soft cushioned insoles and notes improvement in symptoms. She continues the home exercise program. Overall she is improving.   Past Medical History  Diagnosis Date  . Fatty liver   . Esophagitis   . Hepatitis   . Immune deficiency disorder ([place])   . Gastric ulcer    Past Surgical History  Procedure Laterality Date  . Mouth surgery  2013   Social History  Substance Use Topics  . Smoking status: Former Research scientist (life sciences)  . Smokeless tobacco: Not on file  . Alcohol Use: No   family history includes Alcohol abuse in her maternal grandfather; Cancer in her maternal aunt, maternal aunt, maternal grandmother, maternal uncle, and mother; Depression in her sister; Diabetes in her father; Multiple sclerosis in her sister.  ROS as above Medications: Current Outpatient Prescriptions  Medication Sig Dispense Refill  . dexlansoprazole (DEXILANT) 60 MG capsule Take 1 capsule (60 mg total) by mouth daily. 30 capsule 2  . norgestrel-ethinyl estradiol (LOW-OGESTREL) 0.3-30 MG-MCG tablet TK 1 T PO  D    . sucralfate (CARAFATE) 1 g tablet Take 1 g by mouth 4 (four) times daily -  with meals and at bedtime.     No current facility-administered medications for this visit.   Allergies  Allergen Reactions  . Amoxicillin     Make feel tightness of throat and diarrhea and vomiting.   . Azithromycin     Nausea and vomiting.      Exam:  BP 165/73 mmHg  Pulse 76  Wt 305 lb (138.347 kg)  LMP 02/26/2016 Gen: Well NAD Morbidly obese Feet bilaterally with pes planus. Tender to palpation medial plantar calcaneus bilaterally. Normal gait  No results found for this or any previous visit (from the past  24 hour(s)). No results found.   35 year old woman with plantar fasciitis. Improvement. Continued eccentric exercises. Use cushion insole.

## 2016-03-15 NOTE — Patient Instructions (Signed)
Thank you for coming in today. Keep using the insoles.  Keep up the exercises.  Return as needed.

## 2016-03-18 LAB — OTHER SOLSTAS TEST

## 2016-03-26 DIAGNOSIS — R000 Tachycardia, unspecified: Secondary | ICD-10-CM | POA: Insufficient documentation

## 2016-03-26 DIAGNOSIS — R002 Palpitations: Secondary | ICD-10-CM | POA: Insufficient documentation

## 2016-03-29 DIAGNOSIS — L0231 Cutaneous abscess of buttock: Secondary | ICD-10-CM | POA: Insufficient documentation

## 2016-04-03 ENCOUNTER — Ambulatory Visit (INDEPENDENT_AMBULATORY_CARE_PROVIDER_SITE_OTHER): Payer: BLUE CROSS/BLUE SHIELD | Admitting: Physician Assistant

## 2016-04-03 ENCOUNTER — Other Ambulatory Visit: Payer: Self-pay | Admitting: Physician Assistant

## 2016-04-03 ENCOUNTER — Encounter: Payer: Self-pay | Admitting: Physician Assistant

## 2016-04-03 VITALS — BP 151/81 | HR 78 | Ht 67.0 in | Wt 306.0 lb

## 2016-04-03 DIAGNOSIS — M2550 Pain in unspecified joint: Secondary | ICD-10-CM | POA: Diagnosis not present

## 2016-04-03 DIAGNOSIS — M797 Fibromyalgia: Secondary | ICD-10-CM | POA: Diagnosis not present

## 2016-04-03 DIAGNOSIS — R102 Pelvic and perineal pain: Secondary | ICD-10-CM

## 2016-04-03 LAB — CBC WITH DIFFERENTIAL/PLATELET
BASOS ABS: 62 {cells}/uL (ref 0–200)
BASOS PCT: 1 %
EOS ABS: 124 {cells}/uL (ref 15–500)
Eosinophils Relative: 2 %
HEMATOCRIT: 37.5 % (ref 35.0–45.0)
HEMOGLOBIN: 11.8 g/dL (ref 11.7–15.5)
LYMPHS ABS: 2108 {cells}/uL (ref 850–3900)
Lymphocytes Relative: 34 %
MCH: 25.3 pg — AB (ref 27.0–33.0)
MCHC: 31.5 g/dL — ABNORMAL LOW (ref 32.0–36.0)
MCV: 80.3 fL (ref 80.0–100.0)
MONO ABS: 372 {cells}/uL (ref 200–950)
MPV: 10 fL (ref 7.5–12.5)
Monocytes Relative: 6 %
NEUTROS ABS: 3534 {cells}/uL (ref 1500–7800)
Neutrophils Relative %: 57 %
Platelets: 299 10*3/uL (ref 140–400)
RBC: 4.67 MIL/uL (ref 3.80–5.10)
RDW: 15.1 % — ABNORMAL HIGH (ref 11.0–15.0)
WBC: 6.2 10*3/uL (ref 3.8–10.8)

## 2016-04-03 LAB — RHEUMATOID FACTOR: Rhuematoid fact SerPl-aCnc: <10 IU/mL (ref <=14)

## 2016-04-03 NOTE — Progress Notes (Addendum)
   Subjective:    Patient ID: Kayla Meza, female    DOB: [birth year], 35 y.o.   MRN: [id number]  HPI Patient is here today after being seen by her chiropractor who suggested she come in for Lyme's disease and EBV testing. Patient states she has not been feeling well since October 2016. For the past 7 months, she has been having chest pain, palpitations, stomach aches, skin lesions, occasional fevers, back pains, pelvic pain, muscle cramps, joint pains, and head aches. She was seen last Friday, 03/29/16, by her gynecologist for skin lesions on the vagina and was prescribed Bactrim. Patient states that by Sunday, 03/31/16, all of her pain was gone.   She has had an extensive work up over the last 7 months including CBC, CMP, TSH, EKG, holter monitor, colonoscopy, endoscopy, imaging of the knees and pelvis and everything was found to be normal.    Review of Systems  Constitutional: Negative for fever, chills and fatigue.  Cardiovascular: Negative for chest pain, palpitations and leg swelling.  Gastrointestinal: Negative for abdominal pain.  Musculoskeletal: Negative for back pain, joint swelling and arthralgias.  Skin: Negative for rash.  Neurological: Negative for weakness, numbness and headaches.  All other systems reviewed and are negative.      Objective:   Physical Exam  Constitutional: She appears well-developed and well-nourished.  HENT:  Head: Normocephalic and atraumatic.  Eyes: Pupils are equal, round, and reactive to light.  Neck: No tracheal deviation present. No thyromegaly present.  Cardiovascular: Normal rate, regular rhythm and normal heart sounds.  Exam reveals no gallop and no friction rub.   No murmur heard. Pulmonary/Chest: Effort normal and breath sounds normal. No respiratory distress. She has no wheezes. She has no rales. She exhibits no tenderness.  Abdominal: Soft. Bowel sounds are normal. She exhibits no distension and no mass. There is no tenderness. There is no  rebound and no guarding.  Musculoskeletal: Normal range of motion. She exhibits no edema or tenderness.  Skin: Skin is warm and dry. No rash noted.          Assessment & Plan:  1. Joint pain/Muscle pain/Back pain/Pelvic Pain- Patient has had these complaints for the past 7 months. She has had an extensive work up including CBC, CMP, TSH, EKG, holter monitor, colonoscopy, endoscopy, imaging of the knees and pelvis and everything was found to be normal. After being treated with Bactrim for skin lesions by her OBGYN last week, all of her symptoms resolved. Normal PE today. Will order EBV, B. [place]dor[place] Western Blot, CMV, CBC, anti-CCP, RF, and ANA.

## 2016-04-04 LAB — CYCLIC CITRUL PEPTIDE ANTIBODY, IGG

## 2016-04-04 LAB — EPSTEIN-BARR VIRUS VCA ANTIBODY PANEL
EBV EA IgG: 85.6 U/mL — ABNORMAL HIGH (ref <9.0)
EBV NA IgG: 301 U/mL — ABNORMAL HIGH (ref <18.0)

## 2016-04-04 LAB — LYME AB/WESTERN BLOT REFLEX

## 2016-04-04 LAB — ANTI-SCLERODERMA ANTIBODY: SCLERODERMA (SCL-70) (ENA) ANTIBODY, IGG: NEGATIVE

## 2016-04-04 LAB — ANA: Anti Nuclear Antibody(ANA): NEGATIVE

## 2016-04-04 LAB — SJOGREN'S SYNDROME ANTIBODS(SSA + SSB)
SSA (RO) (ENA) ANTIBODY, IGG: NEGATIVE
SSB (LA) (ENA) ANTIBODY, IGG: NEGATIVE

## 2016-04-04 LAB — ANTI-SMITH ANTIBODY: ENA SM AB SER-ACNC: NEGATIVE

## 2016-04-04 LAB — JO-1 ANTIBODY-IGG: Jo-1 Antibody, IgG: <1

## 2016-04-04 LAB — ANTI-RIBONUCLEIC ACID ANTIBODY: SM/RNP: NEGATIVE

## 2016-04-04 LAB — ANTI-DNA ANTIBODY, DOUBLE-STRANDED: ds DNA Ab: 1 IU/mL

## 2016-04-05 LAB — LYME ABY, WSTRN BLT IGG & IGM W/BANDS
B BURGDORFERI IGG ABS (IB): NEGATIVE
B burgdorferi IgM Abs (IB): NEGATIVE
LYME DISEASE 18 KD IGG: NONREACTIVE
LYME DISEASE 23 KD IGG: NONREACTIVE
LYME DISEASE 30 KD IGG: NONREACTIVE
LYME DISEASE 39 KD IGG: NONREACTIVE
LYME DISEASE 41 KD IGM: NONREACTIVE
LYME DISEASE 58 KD IGG: NONREACTIVE
Lyme Disease 23 kD IgM: NONREACTIVE
Lyme Disease 28 kD IgG: NONREACTIVE
Lyme Disease 39 kD IgM: NONREACTIVE
Lyme Disease 41 kD IgG: NONREACTIVE
Lyme Disease 45 kD IgG: NONREACTIVE
Lyme Disease 66 kD IgG: NONREACTIVE
Lyme Disease 93 kD IgG: NONREACTIVE

## 2016-04-08 LAB — HIV ANTIBODY (ROUTINE TESTING W REFLEX): HIV 1&2 Ab, 4th Generation: NONREACTIVE

## 2016-04-12 ENCOUNTER — Telehealth: Payer: Self-pay

## 2016-04-12 DIAGNOSIS — M791 Myalgia, unspecified site: Secondary | ICD-10-CM

## 2016-04-12 LAB — CYTOMEGALOVIRUS PCR, QUALITATIVE: CMV DNA, Qual PCR: NOT DETECTED

## 2016-04-12 NOTE — Telephone Encounter (Signed)
Patient called and asked if she could be advised about the rest of her labs. Please advise.

## 2016-04-12 NOTE — Telephone Encounter (Signed)
HIv negative.  EBV shows hx of exposure but no recent infection.  Is she still feeling good?

## 2016-04-12 NOTE — Telephone Encounter (Signed)
Patient is still having stomach and back pains. She is worried that when she finishes the antibiotic will all the symptoms return. She wants to know what is the next step.

## 2016-04-15 NOTE — Telephone Encounter (Signed)
Ok to send rheumatology referral.

## 2016-04-16 IMAGING — CR DG KNEE COMPLETE 4+V*L*
4 series · 4 of 4 positions shown · non-contrast
Comparison: No prior.

CLINICAL DATA: Pain.  No known injury.  Initial evaluation

EXAM:
LEFT KNEE - COMPLETE 4+ VIEW

[knee lat]
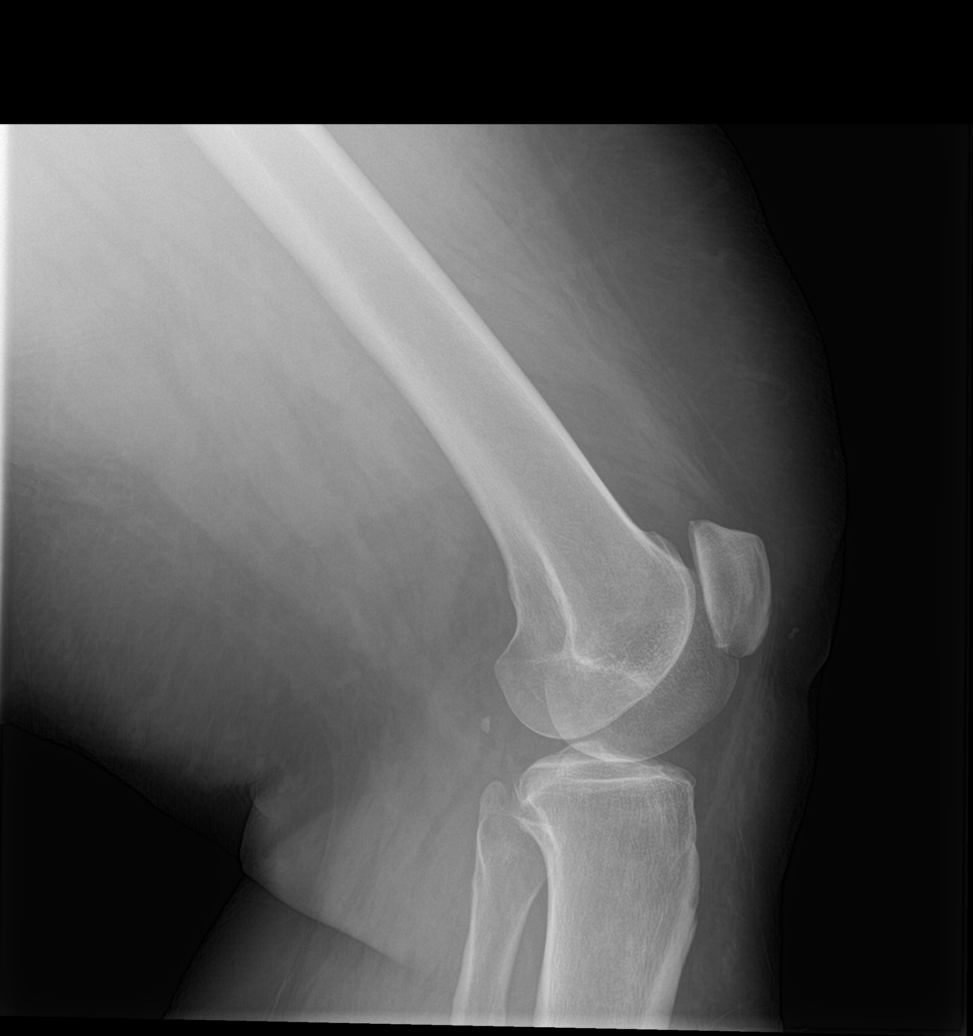

[knee sunrise]
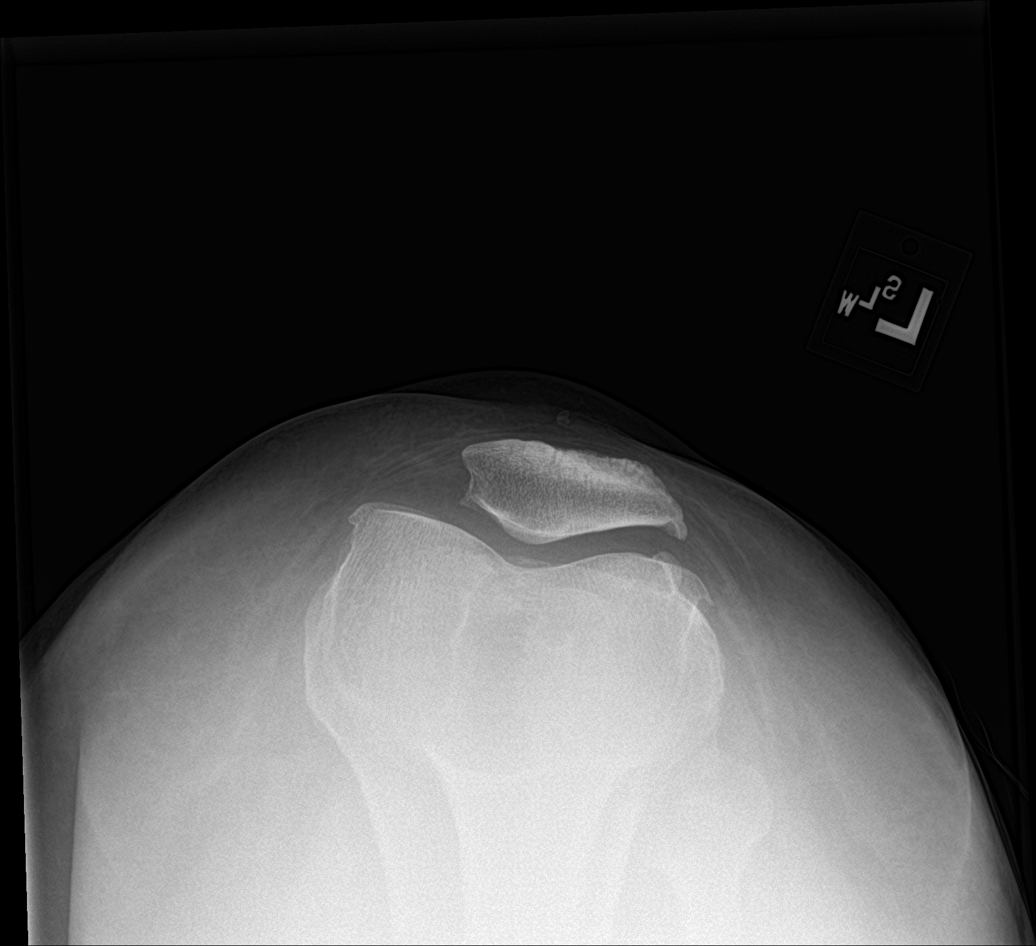

[knee ap bilat standing (1 of 2)]
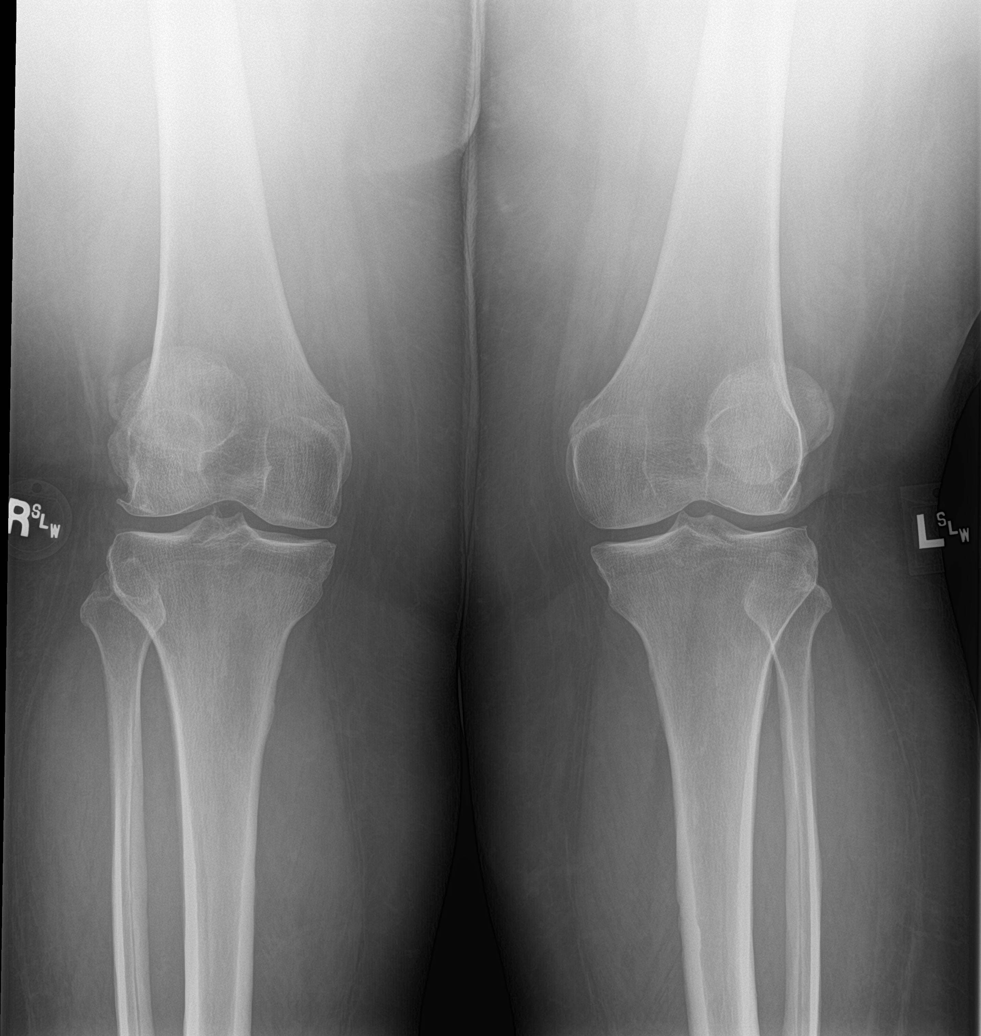

[knee ap bilat standing (2 of 2)]
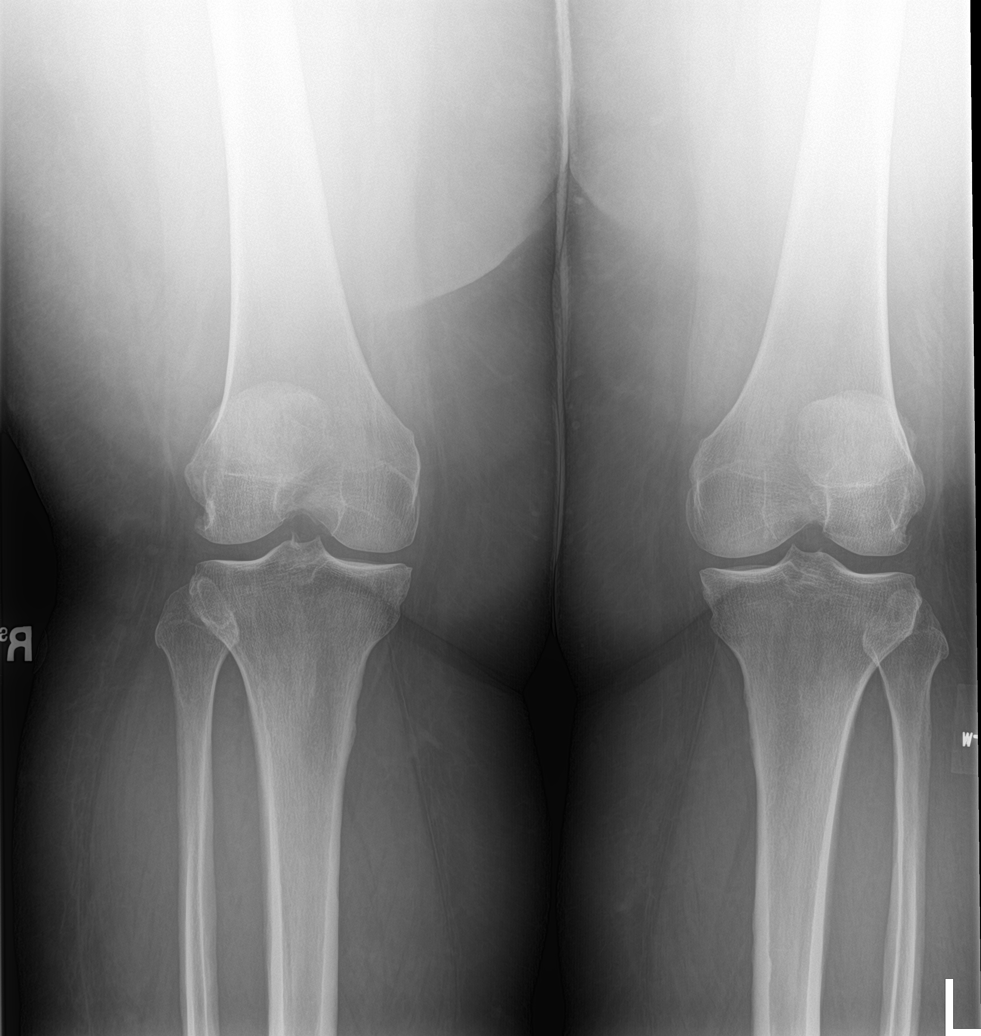

[4 of 4 positions shown; findings below may reference images not displayed]

FINDINGS: Degenerative changes both knees. No acute bony or joint abnormality
identified. No evidence fracture or dislocation.
IMPRESSION: Diffuse degenerative change.  No acute abnormality.

## 2016-04-16 IMAGING — CR DG PELVIS 1-2V
1 series · 1 of 1 positions shown · non-contrast
Comparison: None.

CLINICAL DATA: Bilateral hip pain for 3 years.  No injury.

EXAM:
PELVIS - 1-2 VIEW

[pelvis ap]
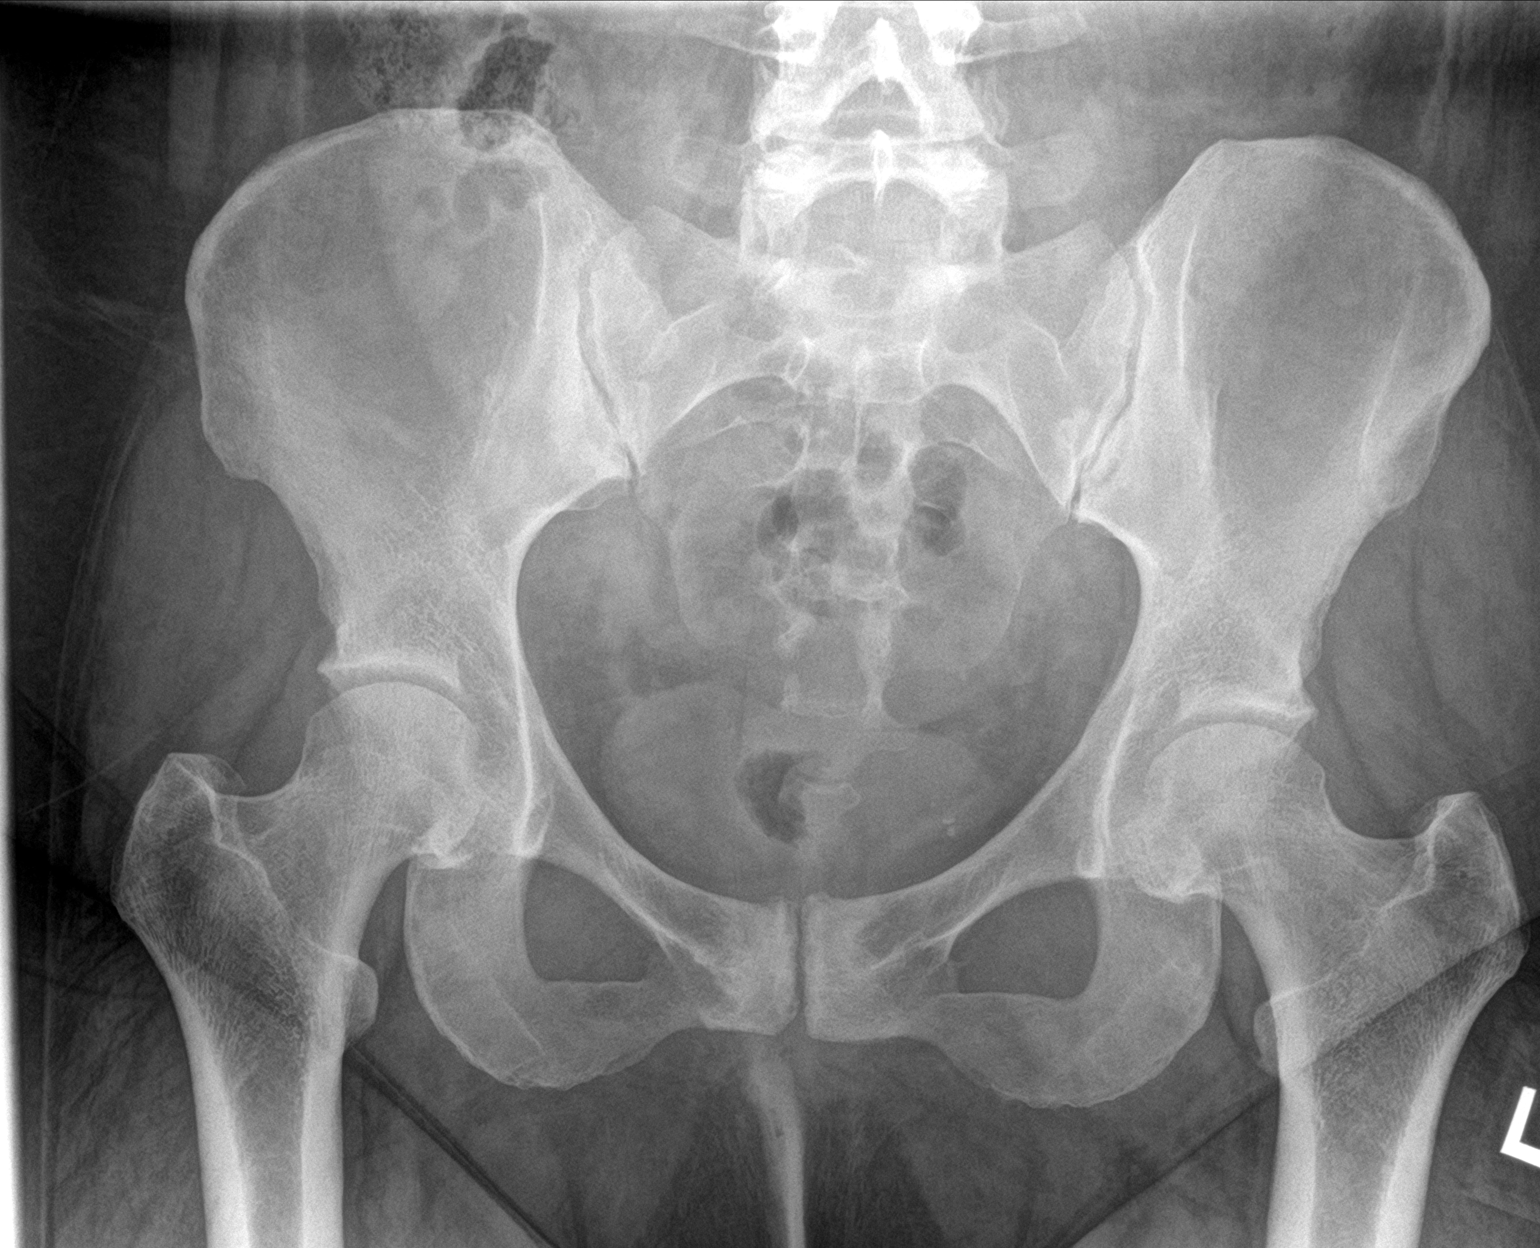

[1 of 1 positions shown; findings below may reference images not displayed]

FINDINGS: No acute fracture. No dislocation. Unremarkable soft tissues.
Minimal degenerative change of the hip joints. Moderate degenerative
change of the right SI joint. Chronic changes at the pubic
symphysis. Minimal degenerative change of the left hip joint.
IMPRESSION: No acute bony pathology.

## 2016-04-16 NOTE — Telephone Encounter (Signed)
Pt would like to know why she was informed there would be a referral placed for infectious disease and now PCP is saying rheumatology. Will route for review.

## 2016-04-16 NOTE — Telephone Encounter (Signed)
There is no dx that infectious disease would see you for. You have symptoms but lymes was negative. Rheumatology can do work up on the myaglias that you have been having. Likely ID will deny referral.

## 2016-04-22 NOTE — Telephone Encounter (Signed)
Referral placed.

## 2016-04-22 NOTE — Telephone Encounter (Signed)
Pt advised of PCP recommendation regarding Rheumatology referral, Pt would prefer [facility] location.

## 2016-08-19 ENCOUNTER — Encounter: Payer: Self-pay | Admitting: Physician Assistant

## 2016-08-19 ENCOUNTER — Ambulatory Visit (INDEPENDENT_AMBULATORY_CARE_PROVIDER_SITE_OTHER): Payer: BLUE CROSS/BLUE SHIELD | Admitting: Physician Assistant

## 2016-08-19 VITALS — BP 132/65 | HR 61 | Ht 67.0 in | Wt 297.0 lb

## 2016-08-19 DIAGNOSIS — R42 Dizziness and giddiness: Secondary | ICD-10-CM | POA: Diagnosis not present

## 2016-08-19 DIAGNOSIS — R5383 Other fatigue: Secondary | ICD-10-CM | POA: Diagnosis not present

## 2016-08-19 DIAGNOSIS — K6389 Other specified diseases of intestine: Secondary | ICD-10-CM | POA: Diagnosis not present

## 2016-08-19 DIAGNOSIS — R911 Solitary pulmonary nodule: Secondary | ICD-10-CM

## 2016-08-19 LAB — VITAMIN B12: VITAMIN B 12: 363 pg/mL (ref 200–1100)

## 2016-08-19 LAB — SEDIMENTATION RATE: Sed Rate: 32 mm/hr — ABNORMAL HIGH (ref 0–20)

## 2016-08-19 LAB — URIC ACID: Uric Acid, Serum: 3.7 mg/dL (ref 2.5–7.0)

## 2016-08-19 NOTE — Progress Notes (Signed)
Subjective:    Patient ID: Kayla Meza, female    DOB: 12/16/1980, 35 y.o.   MRN: [id number]  HPI  Patient is a 35 y.o. Caucasian female presenting today to follow-up on recent abdominal CT findings indicating a 6 mm right pulmonary nodule. The patient reports that she stopped smoking 7 years ago and smoked for a duration of 13 years. The patient states that she smoked very little during this 13 year period, "not even a pack a week". The patient reports having a cough with mucus for a little over a year and admits that she does have acid reflux. The patient states that she has a family history of lung cancer on both sides. The patients maternal uncle, cousin, and fraternal grandmother had lung cancer. The patient notes that she does also have some family history of colon cancer. Additionally, the patient notes that she began having multiple health concerns back in October when she had numerous office and ER visits with complaints of chest pain, lightheadedness, and GI complaints. The patient received a full work-up including endoscopy, colonoscopy, pelvic ultrasound, and abdominal ultrasound. However, the patient still reports that she feels like she has cancer. The patient states that back in October she felt as if she had heart attack symptoms that included paresthesias and increased heart rate. Since then the patient reports feeling lighthead multiple times a day She notes that she never passed out and that it occurs while walking for a duration of 5- 10 seconds. She denies regular headaches and admits that she should be drinking more water. The patient denies symptoms with changes in position and does not intervene during these episodes as they usually pass. The patient reports that she had a Holter monitor done with no abnormal results. Additionally, the patient states that in spring she received bactrim and it resolved all of her symptoms. The patient currently states that she has tonsil stone and some  tonsillar discomfort. Lastly the patient notes that she had multiple tooth infection and had 6-7 teeth removed.   Review of Systems  Constitutional: Positive for fatigue. Negative for activity change, appetite change, fever and unexpected weight change.  Neurological: Positive for dizziness and light-headedness. Negative for numbness and headaches.  See HPI.     Objective:   Physical Exam  Constitutional: She is oriented to person, place, and time. She appears well-developed and well-nourished. No distress.  HENT:  Head: Normocephalic and atraumatic.  Nose: Nose normal.  Mouth/Throat: Oropharynx is clear and moist. No oropharyngeal exudate.  Patient with poor dentition upon inspection. Numerous crypts noted in mildly enlarged tonsils bilaterally.  Eyes: Conjunctivae and EOM are normal. Pupils are equal, round, and reactive to light. Right eye exhibits no discharge. Left eye exhibits no discharge. No scleral icterus.  Neck: Normal range of motion. Neck supple. No JVD present. No tracheal deviation present. No thyromegaly present.  Cardiovascular: Normal rate, regular rhythm, normal heart sounds and intact distal pulses.  Exam reveals no gallop and no friction rub.   No murmur heard. Pulmonary/Chest: Effort normal and breath sounds normal. No stridor. No respiratory distress. She has no wheezes. She has no rales. She exhibits no tenderness.  Abdominal: Soft. Bowel sounds are normal. She exhibits no distension and no mass. There is no tenderness. There is no rebound and no guarding.  Lymphadenopathy:    She has no cervical adenopathy.  Neurological: She is alert and oriented to person, place, and time. She has normal reflexes. She displays normal reflexes. No  cranial nerve deficit. She exhibits normal muscle tone. Coordination normal.  Skin: Skin is warm and dry. No rash noted. She is not diaphoretic. No erythema. No pallor.  Psychiatric: She has a normal mood and affect. Her behavior is  normal. Judgment and thought content normal.      Assessment & Plan:  IMPRESSION:  1. No evidence of hydronephrosis. 2. Stable 6 mm right lower lobe pulmonary nodule. If the patient is low risk for lung cancer, recommend follow-up CT at 6-12 months and optional CT at 18-24 months.If the patient is high risk for lung cancer, recommend follow-up CT at 6-12 months and  again at 18-24 months to ensure stability. (2017 Fleischner Guidelines)  Diagnoses and all orders for this visit:  Incidental lung nodule, > 62m and < 83m No energy -     B12 -     Uric acid -     Vitamin D (25 hydroxy) -     TSH -     C-reactive protein -     Sed Rate (ESR)  Lightheadedness -     B12 -     Uric acid -     Vitamin D (25 hydroxy) -     TSH -     C-reactive protein -     Sed Rate (ESR)  Small intestinal bacterial overgrowth   1.  Incidental lung nodule -  Extensively discussed (15 minutes) with patient the results of recent CT scan indicating a 6 mm right pulmonary nodule. Patient was educated that it is relatively common to find pulmonary nodules and that they require routine monitoring. Patient was determined to have low risk for lung cancer and was recommended to have a follow-up CT scan in 6 months. Patient displayed interest in having a more recent CT san performed and will communicate with the office if this was she would like to do going forward. Patient to schedule spironmetery at her next available time. Will continue to monitor.   2. Small intestinal overgrowth - Discussed with patient that she should follow gastroenterologist suggestion to complete a course of antibiotics.  Patient to continue care with specialist at this time.   3. No energy/lighheadedness - Discussed with patient that her symptoms may be secondary to variety of etiologies. Patient to obtain laboratory work to evaluate vitamin B12, uric acid, vitamin D, C-reactive protein, and ESR. Will determine proper medical  intervention upon receiving results. Patient was instructed to monitor her water and food intake to help identify potential triggers for her lightheadedness.  Will continue to monitor. Patient was instructed to return-to-clinic in 6 weeks or if symptoms persist or worsen.

## 2016-08-19 NOTE — Patient Instructions (Signed)
Schedule spironmetery. Follow up CT in 6 months.

## 2016-08-20 LAB — C-REACTIVE PROTEIN: CRP: 16.1 mg/L — ABNORMAL HIGH (ref <8.0)

## 2016-08-20 LAB — TSH: TSH: 1.88 mIU/L

## 2016-08-20 LAB — VITAMIN D 25 HYDROXY (VIT D DEFICIENCY, FRACTURES): Vit D, 25-Hydroxy: 18 ng/mL — ABNORMAL LOW (ref 30–100)

## 2016-09-12 ENCOUNTER — Ambulatory Visit: Payer: BLUE CROSS/BLUE SHIELD | Admitting: Family Medicine

## 2016-09-13 DIAGNOSIS — K828 Other specified diseases of gallbladder: Secondary | ICD-10-CM | POA: Insufficient documentation

## 2016-09-24 ENCOUNTER — Ambulatory Visit (INDEPENDENT_AMBULATORY_CARE_PROVIDER_SITE_OTHER): Payer: BLUE CROSS/BLUE SHIELD

## 2016-09-24 ENCOUNTER — Ambulatory Visit (INDEPENDENT_AMBULATORY_CARE_PROVIDER_SITE_OTHER): Payer: BLUE CROSS/BLUE SHIELD | Admitting: Sports Medicine

## 2016-09-24 ENCOUNTER — Encounter: Payer: Self-pay | Admitting: Sports Medicine

## 2016-09-24 DIAGNOSIS — R0989 Other specified symptoms and signs involving the circulatory and respiratory systems: Secondary | ICD-10-CM

## 2016-09-24 DIAGNOSIS — R05 Cough: Secondary | ICD-10-CM

## 2016-09-24 DIAGNOSIS — I517 Cardiomegaly: Secondary | ICD-10-CM | POA: Diagnosis not present

## 2016-09-24 DIAGNOSIS — J209 Acute bronchitis, unspecified: Secondary | ICD-10-CM

## 2016-09-24 MED ORDER — BENZONATATE 200 MG PO CAPS: 200.0000 mg | ORAL_CAPSULE | Freq: Three times a day (TID) | ORAL | 0 refills | Status: DC | PRN

## 2016-09-24 MED ORDER — CLARITHROMYCIN 500 MG PO TABS: 500.0000 mg | ORAL_TABLET | Freq: Two times a day (BID) | ORAL | 0 refills | Status: DC

## 2016-09-24 MED ORDER — PREDNISONE 50 MG PO TABS: 50.0000 mg | ORAL_TABLET | Freq: Every day | ORAL | 0 refills | Status: DC

## 2016-09-24 NOTE — Assessment & Plan Note (Signed)
With cough and congestion, generally I would not treat this this early with antibiotics, over she does have a cholecystectomy coming up in a week or so, so we need things clear. Prednisone, clarithromycin, Tessalon Perles, chest x-ray. Return to see me as needed.

## 2016-09-24 NOTE — Progress Notes (Signed)
  Subjective:    CC: Coughing  HPI: This is a pleasant 35 year old female, for several days she's had increasing cough, nasal congestion. She understands that typically we don't treat this with antibiotics orally however she does have an upcoming cholecystectomy in a week and a half. Symptoms are moderate, persistent, no shortness of breath, no nasal discharge, no sinus pain or pressure. Cough is productive of greenish sputum but nothing bloody.  Past medical history:  Negative.  See flowsheet/record as well for more information.  Surgical history: Negative.  See flowsheet/record as well for more information.  Family history: Negative.  See flowsheet/record as well for more information.  Social history: Negative.  See flowsheet/record as well for more information.  Allergies, and medications have been entered into the medical record, reviewed, and no changes needed.   Review of Systems: No fevers, chills, night sweats, weight loss, chest pain, or shortness of breath.   Objective:    General: Well Developed, well nourished, and in no acute distress.  Neuro: Alert and oriented x3, extra-ocular muscles intact, sensation grossly intact.  HEENT: Normocephalic, atraumatic, pupils equal round reactive to light, neck supple, no masses, no lymphadenopathy, thyroid nonpalpable. Oropharynx, nasopharynx, ear canals unremarkable Skin: Warm and dry, no rashes. Cardiac: Regular rate and rhythm, no murmurs rubs or gallops, no lower extremity edema.  Respiratory: Clear to auscultation bilaterally. Not using accessory muscles, speaking in full sentences.  Impression and Recommendations:    Acute bronchitis With cough and congestion, generally I would not treat this this early with antibiotics, over she does have a cholecystectomy coming up in a week or so, so we need things clear. Prednisone, clarithromycin, Tessalon Perles, chest x-ray. Return to see me as needed.

## 2016-09-30 ENCOUNTER — Other Ambulatory Visit: Payer: BLUE CROSS/BLUE SHIELD

## 2016-10-02 ENCOUNTER — Encounter: Payer: Self-pay | Admitting: Emergency Medicine

## 2016-10-03 ENCOUNTER — Ambulatory Visit: Payer: BLUE CROSS/BLUE SHIELD | Admitting: Family Medicine

## 2016-10-17 ENCOUNTER — Ambulatory Visit (INDEPENDENT_AMBULATORY_CARE_PROVIDER_SITE_OTHER): Payer: BLUE CROSS/BLUE SHIELD | Admitting: Family Medicine

## 2016-10-17 ENCOUNTER — Encounter: Payer: Self-pay | Admitting: Family Medicine

## 2016-10-17 DIAGNOSIS — J209 Acute bronchitis, unspecified: Secondary | ICD-10-CM | POA: Diagnosis not present

## 2016-10-17 MED ORDER — CLARITHROMYCIN 500 MG PO TABS: 500.0000 mg | ORAL_TABLET | Freq: Two times a day (BID) | ORAL | 0 refills | Status: DC

## 2016-10-17 MED ORDER — PREDNISONE 50 MG PO TABS: 50.0000 mg | ORAL_TABLET | Freq: Every day | ORAL | 0 refills | Status: DC

## 2016-10-17 MED ORDER — IPRATROPIUM BROMIDE 0.06 % NA SOLN: 2.0000 | NASAL | 6 refills | Status: DC | PRN

## 2016-10-17 NOTE — Patient Instructions (Signed)
Thank you for coming in today. Call or go to the emergency room if you get worse, have trouble breathing, have chest pains, or palpitations.  Return as needed.

## 2016-10-17 NOTE — Progress Notes (Signed)
       Kayla Meza is a 35 y.o. female who presents to [facility] today for ongoing cough and new sinus congestion.   She saw Kayla Meza in October for a mild sore throat and cough with light green sputum, was diagnosed with bronchitis, and was given clarithromycin, prednisone, and tessalon to clear the infection before abdominal surgery the following week. She took the clarithromycin and prednisone, which helped clear the mucus, but the cough persisted. This past Saturday at the hospital, she started coughing up thick, dark green mucus and the sore throat returned. She also had sinus congestion and ear pressure. No fever, vomiting, diarrhea, or blood in mucus. Has not taken any medications since symptoms restarted. Her children and husband are all sick at home.  She underwent lap chole, liver biopsy, and BTL without complications on [id number]. She visited the ED 10/11/16 for chest pain with a negative workup, since resolved.  Past Medical History:  Diagnosis Date  . Esophagitis   . Fatty liver   . Gastric ulcer   . Hepatitis   . Immune deficiency disorder [facility])    Past Surgical History:  Procedure Laterality Date  . MOUTH SURGERY  2013   Social History  Substance Use Topics  . Smoking status: Former Research scientist (life sciences)  . Smokeless tobacco: Not on file  . Alcohol use No   family history includes Alcohol abuse in her maternal grandfather; Cancer in her maternal aunt, maternal aunt, maternal grandmother, maternal uncle, and mother; Depression in her sister; Diabetes in her father; Multiple sclerosis in her sister.  ROS as above:  Medications: Current Outpatient Prescriptions  Medication Sig Dispense Refill  . norgestrel-ethinyl estradiol (LOW-OGESTREL) 0.3-30 MG-MCG tablet TK 1 T PO  D     No current facility-administered medications for this visit.    Allergies  Allergen Reactions    . Amoxicillin Anaphylaxis    Make feel tightness of throat and diarrhea and vomiting.   . Erythromycin Nausea And Vomiting  . Azithromycin     Nausea and vomiting.     Health Maintenance Health Maintenance  Topic Date Due  . TETANUS/TDAP  04/22/2000  . INFLUENZA VACCINE  07/09/2016  . PAP SMEAR  10/30/2018  . COLONOSCOPY  02/15/2021  . HIV Screening  Completed     Exam:  BP 133/67   Pulse 81   Temp 98.8 F (37.1 C) (Oral)   Wt 286 lb (129.7 kg)   BMI 44.79 kg/m  Gen: Ill-appearing, but non-toxic HEENT: L maxillary sinus minimally tender to palpation, conjunctiva clear, MMM, tympanic membranes normal bilaterally, erythematous nasal turbinates, throat without erythema or tonsilar exudate Lungs: Normal work of breathing. CTABL without wheezes, rhonchi, or crackles Heart: RRR no MRG Abd: NABS, soft, nontender Exts: Brisk capillary refill, warm and well perfused.    No results found for this or any previous visit (from the past 72 hour(s)). No results found.   Assessment and Plan: 35 y.o. female with productive cough and nasal congestion in the setting of recent hospitalization and several doctors' visits. Most likely acute bronchitis and viral sinusitis, possibly acquired from medical setting. Given previous improvement of bronchitis with clarithromycin and prednisone, start second course of both. Atrovent for nasal congestion.    No orders of the defined types were placed in this encounter.  Discussed warning signs or symptoms. Please see discharge instructions. Patient expresses understanding.

## 2016-11-19 ENCOUNTER — Ambulatory Visit (INDEPENDENT_AMBULATORY_CARE_PROVIDER_SITE_OTHER): Payer: BLUE CROSS/BLUE SHIELD | Admitting: Family Medicine

## 2016-11-19 ENCOUNTER — Ambulatory Visit (INDEPENDENT_AMBULATORY_CARE_PROVIDER_SITE_OTHER): Payer: BLUE CROSS/BLUE SHIELD

## 2016-11-19 ENCOUNTER — Encounter: Payer: Self-pay | Admitting: Family Medicine

## 2016-11-19 DIAGNOSIS — M722 Plantar fascial fibromatosis: Secondary | ICD-10-CM

## 2016-11-19 DIAGNOSIS — M25572 Pain in left ankle and joints of left foot: Secondary | ICD-10-CM | POA: Diagnosis not present

## 2016-11-19 DIAGNOSIS — M79672 Pain in left foot: Secondary | ICD-10-CM

## 2016-11-19 DIAGNOSIS — M7742 Metatarsalgia, left foot: Secondary | ICD-10-CM

## 2016-11-19 NOTE — Patient Instructions (Signed)
Thank you for coming in today. Use the metatarsal pads.  Do the heel exercises.  Take 2 extra strength tylenol every 6 hours while working and up to 2 aleve twice daily for pain while working.     Plantar Fasciitis Plantar fasciitis is a painful foot condition that affects the heel. It occurs when the band of tissue that connects the toes to the heel bone (plantar fascia) becomes irritated. This can happen after exercising too much or doing other repetitive activities (overuse injury). The pain from plantar fasciitis can range from mild irritation to severe pain that makes it difficult for you to walk or move. The pain is usually worse in the morning or after you have been sitting or lying down for a while. CAUSES This condition may be caused by:  Standing for long periods of time.  Wearing shoes that do not fit.  Doing high-impact activities, including running, aerobics, and ballet.  Being overweight.  Having an abnormal way of walking (gait).  Having tight calf muscles.  Having high arches in your feet.  Starting a new athletic activity. SYMPTOMS The main symptom of this condition is heel pain. Other symptoms include:  Pain that gets worse after activity or exercise.  Pain that is worse in the morning or after resting.  Pain that goes away after you walk for a few minutes. DIAGNOSIS This condition may be diagnosed based on your signs and symptoms. Your health care provider will also do a physical exam to check for:  A tender area on the bottom of your foot.  A high arch in your foot.  Pain when you move your foot.  Difficulty moving your foot. You may also need to have imaging studies to confirm the diagnosis. These can include:  X-rays.  Ultrasound.  MRI. TREATMENT  Treatment for plantar fasciitis depends on the severity of the condition. Your treatment may include:  Rest, ice, and over-the-counter pain medicines to manage your pain.  Exercises to stretch  your calves and your plantar fascia.  A splint that holds your foot in a stretched, upward position while you sleep (night splint).  Physical therapy to relieve symptoms and prevent problems in the future.  Cortisone injections to relieve severe pain.  Extracorporeal shock wave therapy (ESWT) to stimulate damaged plantar fascia with electrical impulses. It is often used as a last resort before surgery.  Surgery, if other treatments have not worked after 12 months. HOME CARE INSTRUCTIONS  Take medicines only as directed by your health care provider.  Avoid activities that cause pain.  Roll the bottom of your foot over a bag of ice or a bottle of cold water. Do this for 20 minutes, 3-4 times a day.  Perform simple stretches as directed by your health care provider.  Try wearing athletic shoes with air-sole or gel-sole cushions or soft shoe inserts.  Wear a night splint while sleeping, if directed by your health care provider.  Keep all follow-up appointments with your health care provider. PREVENTION   Do not perform exercises or activities that cause heel pain.  Consider finding low-impact activities if you continue to have problems.  Lose weight if you need to. The best way to prevent plantar fasciitis is to avoid the activities that aggravate your plantar fascia. SEEK MEDICAL CARE IF:  Your symptoms do not go away after treatment with home care measures.  Your pain gets worse.  Your pain affects your ability to move or do your daily activities. This information  is not intended to replace advice given to you by your health care provider. Make sure you discuss any questions you have with your health care provider. Document Released: 08/20/2001 Document Revised: 03/18/2016 Document Reviewed: 10/05/2014 Elsevier Interactive Patient Education  2017 [employer].

## 2016-11-19 NOTE — Progress Notes (Signed)
Kayla Meza is a 35 y.o. female who presents to [facility] today for left foot pain. Patient notes ongoing left foot pain. She's had pain for years now. Previously she's been diagnosed with plantar fasciitis. She's use custom orthotics which did not help. She is use of home exercise programs which she continues intermittently which helped very little. She has bothersome heel pain especially with prolonged standing.  She notes worsening for foot pain as well. She has pain around the second toe worse with prolonged standing and walking. She notes decreased toe motion and pain. This is been present for a few months and has occurred without injury. Has not yet been worked up. She has used some leftover Norco which helped for her pain control.   Past Medical History:  Diagnosis Date  . Esophagitis   . Fatty liver   . Gastric ulcer   . Hepatitis   . Immune deficiency disorder [facility])    Past Surgical History:  Procedure Laterality Date  . MOUTH SURGERY  2013   Social History  Substance Use Topics  . Smoking status: Former Research scientist (life sciences)  . Smokeless tobacco: Not on file  . Alcohol use No     ROS:  As above   Medications: Current Outpatient Prescriptions  Medication Sig Dispense Refill  . clarithromycin (BIAXIN) 500 MG tablet Take 1 tablet (500 mg total) by mouth 2 (two) times daily. 14 tablet 0  . ipratropium (ATROVENT) 0.06 % nasal spray Place 2 sprays into both nostrils every 4 (four) hours as needed for rhinitis. 10 mL 6  . norgestrel-ethinyl estradiol (LOW-OGESTREL) 0.3-30 MG-MCG tablet TK 1 T PO  D    . predniSONE (DELTASONE) 50 MG tablet Take 1 tablet (50 mg total) by mouth daily. 5 tablet 0   No current facility-administered medications for this visit.    Allergies  Allergen Reactions  . Amoxicillin Anaphylaxis    Make feel tightness of throat and diarrhea and vomiting.   . Erythromycin Nausea And Vomiting  . Azithromycin    Nausea and vomiting.      Exam:  BP 128/65   Pulse (!) 53   Wt 296 lb (134.3 kg)   LMP 11/05/2016   BMI 46.36 kg/m  General: Well Developed, well nourished, and in no acute distress.  Neuro/Psych: Alert and oriented x3, extra-ocular muscles intact, able to move all 4 extremities, sensation grossly intact. Skin: Warm and dry, no rashes noted.  Respiratory: Not using accessory muscles, speaking in full sentences, trachea midline.  Cardiovascular: Pulses palpable, no extremity edema. Abdomen: Does not appear distended. MSK: Left foot is unremarkable appearing Pulses capillary refill and sensation are intact distally. Tender palpation plantar calcaneus. Mildly tender palpation second MTP.   normal passive toe motion throughout Slight decreased active second toe dorsiflexion.     No results found for this or any previous visit (from the past 48 hour(s)). Dg Foot Complete Left  Result Date: 11/19/2016 CLINICAL DATA:  Chronic left heel and metatarsal pain EXAM: LEFT FOOT - COMPLETE 3+ VIEW COMPARISON:  None available FINDINGS: There is no evidence of fracture or dislocation. There is no evidence of arthropathy or other focal bone abnormality. Soft tissues are unremarkable. IMPRESSION: No acute osseous finding Electronically Signed   By: Jerilynn Mages.  Shick M.D.   On: 11/19/2016 11:30      Assessment and Plan: 35 y.o. female with  Left foot pain due to a combination of plantar fasciitis and probable metatarsalgia of the second  toe. Treat with eccentric calf exercises and heel cushioning. Additionally we'll use a metatarsal pad. Recheck in a month or so. If not better would consider further diagnostic imaging tests such as MRI to look for stress fractures or tendinitis    Orders Placed This Encounter  Procedures  . DG Foot Complete Left    Standing Status:   Future    Number of Occurrences:   1    Standing Expiration Date:   01/20/2018    Order Specific Question:   Reason for Exam  (SYMPTOM  OR DIAGNOSIS REQUIRED)    Answer:   Pain in heel and in 2nd MTP    Order Specific Question:   Is patient pregnant?    Answer:   No    Order Specific Question:   Preferred imaging location?    Answer:   [facility]    Discussed warning signs or symptoms. Please see discharge instructions. Patient expresses understanding.

## 2016-12-09 DIAGNOSIS — E042 Nontoxic multinodular goiter: Secondary | ICD-10-CM

## 2016-12-09 HISTORY — DX: Nontoxic multinodular goiter: E04.2

## 2016-12-18 ENCOUNTER — Ambulatory Visit (INDEPENDENT_AMBULATORY_CARE_PROVIDER_SITE_OTHER): Payer: BLUE CROSS/BLUE SHIELD | Admitting: Sports Medicine

## 2016-12-18 ENCOUNTER — Ambulatory Visit (INDEPENDENT_AMBULATORY_CARE_PROVIDER_SITE_OTHER): Payer: BLUE CROSS/BLUE SHIELD

## 2016-12-18 ENCOUNTER — Encounter: Payer: Self-pay | Admitting: Sports Medicine

## 2016-12-18 DIAGNOSIS — M62838 Other muscle spasm: Secondary | ICD-10-CM | POA: Diagnosis not present

## 2016-12-18 DIAGNOSIS — M5412 Radiculopathy, cervical region: Secondary | ICD-10-CM | POA: Diagnosis not present

## 2016-12-18 MED ORDER — PREDNISONE 50 MG PO TABS: ORAL_TABLET | ORAL | 0 refills | Status: DC

## 2016-12-18 MED ORDER — CYCLOBENZAPRINE HCL 10 MG PO TABS: ORAL_TABLET | ORAL | 0 refills | Status: DC

## 2016-12-18 NOTE — Progress Notes (Signed)
   Subjective:    I'm seeing this patient as a consultation for:  Iran Planas, PA-C  CC: right shoulder pain  HPI: This is a pleasant 36 year old female, she several months post-laparoscopic cholecystectomy, unfortunately she has developed increasing pain in her neck, shoulder without radiation down the arm, minimal pain around the periscapular region, pain is not abdominal, not associated with food and doesn't feel like her previous biliary colicky type symptoms. Mild, persistent. No trauma. No constitutional symptoms.  Past medical history:  Negative.  See flowsheet/record as well for more information.  Surgical history: Negative.  See flowsheet/record as well for more information.  Family history: Negative.  See flowsheet/record as well for more information.  Social history: Negative.  See flowsheet/record as well for more information.  Allergies, and medications have been entered into the medical record, reviewed, and no changes needed.   Review of Systems: No headache, visual changes, nausea, vomiting, diarrhea, constipation, dizziness, abdominal pain, skin rash, fevers, chills, night sweats, weight loss, swollen lymph nodes, body aches, joint swelling, muscle aches, chest pain, shortness of breath, mood changes, visual or auditory hallucinations.   Objective:   General: Well Developed, well nourished, and in no acute distress.  Neuro/Psych: Alert and oriented x3, extra-ocular muscles intact, able to move all 4 extremities, sensation grossly intact. Skin: Warm and dry, no rashes noted.  Respiratory: Not using accessory muscles, speaking in full sentences, trachea midline.  Cardiovascular: Pulses palpable, no extremity edema. Abdomen: Does not appear distended. Neck: Negative spurling's Full neck range of motion Grip strength and sensation normal in bilateral hands Strength good C4 to T1 distribution No sensory change to C4 to T1 Reflexes normal Tender to palpation over the  right trapezius and rhomboids  X-rays show the classic loss of cervical lordosis associated with spasm  Impression and Recommendations:   This case required medical decision making of moderate complexity.  Cervical paraspinal muscle spasm Right-sided with some periscapular radicular symptoms. Home rehabilitation exercises, prednisone, Flexeril at bedtime, x-rays.  Return in one month.

## 2016-12-18 NOTE — Assessment & Plan Note (Signed)
Right-sided with some periscapular radicular symptoms. Home rehabilitation exercises, prednisone, Flexeril at bedtime, x-rays.  Return in one month.

## 2016-12-20 ENCOUNTER — Ambulatory Visit: Payer: BLUE CROSS/BLUE SHIELD | Admitting: Family Medicine

## 2016-12-24 ENCOUNTER — Ambulatory Visit (INDEPENDENT_AMBULATORY_CARE_PROVIDER_SITE_OTHER): Payer: BLUE CROSS/BLUE SHIELD | Admitting: Physician Assistant

## 2016-12-24 ENCOUNTER — Encounter: Payer: Self-pay | Admitting: Physician Assistant

## 2016-12-24 VITALS — BP 150/84 | HR 105 | Temp 97.9°F | Ht 67.0 in | Wt 296.0 lb

## 2016-12-24 DIAGNOSIS — E041 Nontoxic single thyroid nodule: Secondary | ICD-10-CM | POA: Diagnosis not present

## 2016-12-24 DIAGNOSIS — R197 Diarrhea, unspecified: Secondary | ICD-10-CM | POA: Diagnosis not present

## 2016-12-24 DIAGNOSIS — R195 Other fecal abnormalities: Secondary | ICD-10-CM | POA: Diagnosis not present

## 2016-12-24 DIAGNOSIS — R918 Other nonspecific abnormal finding of lung field: Secondary | ICD-10-CM | POA: Diagnosis not present

## 2016-12-24 LAB — T3, FREE: T3 FREE: 2.8 pg/mL (ref 2.3–4.2)

## 2016-12-24 LAB — T4, FREE: Free T4: 1.1 ng/dL (ref 0.8–1.8)

## 2016-12-24 LAB — TSH: TSH: 1.62 m[IU]/L

## 2016-12-24 MED ORDER — DICYCLOMINE HCL 10 MG PO CAPS: 10.0000 mg | ORAL_CAPSULE | Freq: Two times a day (BID) | ORAL | 0 refills | Status: DC

## 2016-12-24 NOTE — Patient Instructions (Signed)
[facility] ask GI.

## 2016-12-24 NOTE — Progress Notes (Signed)
   Subjective:    Patient ID: Kayla Meza, female    DOB: [birth year], 36 y.o.   MRN: [id number]  HPI  Pt is a 36 yo female who presents to the clinic for ED follow up on 12/19/16 for palpitations and tachycardia. Suspect that she was having an adverse reaction to abx and prednisone given 12/30. D-dimer was elevated and CTA was done that was negative for PE/pneuomnia. She was found to have 2 pulmonary nodules in right lower lobe at 29mm. She was also found to have 29mm nodule on thyroid. TSH, PT/INR, UA, UDS, lipase, cardiac enzymes are all unremarkable. EKG showed sinus tachycardia.   She is wanting follow up on the things found as she is very concerned. She is also concerned now because of stool changes. She is seeing "white things" in her stool. She is having frequent stools like 4-5 times a day. She is seeing GI. She has had cholecystemoy in 09/2016 and normal liver biopsy. She is having some abdominal cramping but usually relieved with bowel movements.        Review of Systems    see HPI.  Objective:   Physical Exam  Constitutional: She is oriented to person, place, and time. She appears well-developed and well-nourished.  HENT:  Head: Normocephalic and atraumatic.  Cardiovascular: Regular rhythm and normal heart sounds.   tachycardia  Pulmonary/Chest: Effort normal and breath sounds normal. She has no wheezes.  Abdominal: Soft. Bowel sounds are normal. She exhibits no distension and no mass. There is no tenderness. There is no rebound and no guarding.  Neurological: She is alert and oriented to person, place, and time.  Psychiatric: She has a normal mood and affect. Her behavior is normal.          Assessment & Plan:  Marland KitchenMarland KitchenDiagnoses and all orders for this visit:  Thyroid nodule -     Thyroid Peroxidase Antibody -     T4, free -     T3, free -     TSH -     US THYROID; Future -     Ambulatory referral to Endocrinology  Pulmonary nodules -     Ambulatory referral to  Pulmonology  Change in stool -     dicyclomine (BENTYL) 10 MG capsule; Take 1 capsule (10 mg total) by mouth 2 (two) times daily with a meal.  Diarrhea, unspecified type -     dicyclomine (BENTYL) 10 MG capsule; Take 1 capsule (10 mg total) by mouth 2 (two) times daily with a meal.   Will evaluate findings in ED.  Discussed Pulmonary Nodule follow up would be follow up CT in 6-12 months. She is concerned that something should not be done sooner. Request pulmonology referral.   Will follow up and send u/s and labs to endocrinologist.    Will try benytl as symptoms sound like could be some IBS. She was given Xifaxan but could not swallow pill. Encouraged to to complete this treatment. It could really help her. Follow up with GI.

## 2016-12-25 ENCOUNTER — Other Ambulatory Visit: Payer: BLUE CROSS/BLUE SHIELD

## 2016-12-25 LAB — THYROID PEROXIDASE ANTIBODY

## 2016-12-26 ENCOUNTER — Encounter: Payer: Self-pay | Admitting: Physician Assistant

## 2016-12-26 DIAGNOSIS — R197 Diarrhea, unspecified: Secondary | ICD-10-CM | POA: Insufficient documentation

## 2016-12-26 DIAGNOSIS — E041 Nontoxic single thyroid nodule: Secondary | ICD-10-CM | POA: Insufficient documentation

## 2016-12-26 DIAGNOSIS — R195 Other fecal abnormalities: Secondary | ICD-10-CM | POA: Insufficient documentation

## 2016-12-26 DIAGNOSIS — R918 Other nonspecific abnormal finding of lung field: Secondary | ICD-10-CM | POA: Insufficient documentation

## 2016-12-27 ENCOUNTER — Ambulatory Visit: Payer: BLUE CROSS/BLUE SHIELD | Admitting: Family Medicine

## 2016-12-27 NOTE — Progress Notes (Signed)
Call pt: thyroid antibodies negative. Free T4 levels are normal. Kandra Nicolas levels are normal.  Please print and fax to endocrinology referral to tracie farmer(give to cindy)

## 2016-12-30 ENCOUNTER — Ambulatory Visit: Payer: BLUE CROSS/BLUE SHIELD

## 2016-12-30 DIAGNOSIS — E041 Nontoxic single thyroid nodule: Secondary | ICD-10-CM

## 2016-12-30 NOTE — Progress Notes (Signed)
Call pt: 1.2cm right pulmonary nodule with suggested follow up in in 1 year.  Please fax to endocrinologist that referred her too.

## 2017-01-03 ENCOUNTER — Ambulatory Visit (INDEPENDENT_AMBULATORY_CARE_PROVIDER_SITE_OTHER): Payer: BLUE CROSS/BLUE SHIELD | Admitting: Physician Assistant

## 2017-01-03 VITALS — BP 109/75 | HR 76 | Temp 98.7°F | Wt 283.0 lb

## 2017-01-03 DIAGNOSIS — Z711 Person with feared health complaint in whom no diagnosis is made: Secondary | ICD-10-CM | POA: Diagnosis not present

## 2017-01-03 DIAGNOSIS — B370 Candidal stomatitis: Secondary | ICD-10-CM | POA: Diagnosis not present

## 2017-01-03 DIAGNOSIS — R5381 Other malaise: Secondary | ICD-10-CM | POA: Diagnosis not present

## 2017-01-03 DIAGNOSIS — R350 Frequency of micturition: Secondary | ICD-10-CM

## 2017-01-03 LAB — CBC WITH DIFFERENTIAL/PLATELET
BASOS PCT: 1 %
Basophils Absolute: 87 cells/uL (ref 0–200)
EOS ABS: 87 {cells}/uL (ref 15–500)
EOS PCT: 1 %
HCT: 40.5 % (ref 35.0–45.0)
Hemoglobin: 12.9 g/dL (ref 11.7–15.5)
LYMPHS PCT: 28 %
Lymphs Abs: 2436 cells/uL (ref 850–3900)
MCH: 26 pg — ABNORMAL LOW (ref 27.0–33.0)
MCHC: 31.9 g/dL — ABNORMAL LOW (ref 32.0–36.0)
MCV: 81.5 fL (ref 80.0–100.0)
MONOS PCT: 5 %
MPV: 10.4 fL (ref 7.5–12.5)
Monocytes Absolute: 435 cells/uL (ref 200–950)
NEUTROS ABS: 5655 {cells}/uL (ref 1500–7800)
Neutrophils Relative %: 65 %
PLATELETS: 213 10*3/uL (ref 140–400)
RBC: 4.97 MIL/uL (ref 3.80–5.10)
RDW: 15.4 % — AB (ref 11.0–15.0)
WBC: 8.7 10*3/uL (ref 3.8–10.8)

## 2017-01-03 LAB — POCT URINALYSIS DIPSTICK
Blood, UA: NEGATIVE
Glucose, UA: NEGATIVE
LEUKOCYTES UA: NEGATIVE
NITRITE UA: NEGATIVE
PH UA: 5.5
PROTEIN UA: 30
Spec Grav, UA: >=1.03
UROBILINOGEN UA: 0.2

## 2017-01-03 MED ORDER — MAGIC MOUTHWASH W/LIDOCAINE: ORAL | 1 refills | Status: DC

## 2017-01-03 NOTE — Patient Instructions (Signed)
Use Magic Mouthwash up to 4 times daily as needed for pain (swish and spit) Come back and see me in 7-10 days to follow-up :)

## 2017-01-03 NOTE — Progress Notes (Signed)
HPI:                                                                Kayla Meza is a 36 y.o. female who presents to [facility] today for concerns about oral thrush and urinary frequency  Patient presents today with multiple somatic complaints (see ROS)  She has been seen by multiple providers and feels that her problems are being "dismissed." She was most recently seen by [facility] on 12/28/16 for concerns that she had yeast or parasites in her stool. Stool samples pending, but provider felt this was undigested food in the stool and she was diagnosed as "worried well." She was also seen on 12/24/16 and diagnosed with oral candidiasis and treated with fluconazole and nystatin.   She continues to have urinary frequency and occasional dysuria today. She denies abdominal/flank pain or bladder pressure. She denies abnormal vaginal discharge or bleeding. She is sexually active with 1 female partner (her husband). Urine culture on 12/28/16 showed mixed urogenital flora 25,000-50,000  She continues to have painful oral lesions. She denies odynophagia. She is on day 10 of fluconazole.    Past Medical History:  Diagnosis Date  . Esophagitis   . Fatty liver   . Gastric ulcer   . Hepatitis   . Immune deficiency disorder [facility])    Past Surgical History:  Procedure Laterality Date  . MOUTH SURGERY  2013   Social History  Substance Use Topics  . Smoking status: Former Research scientist (life sciences)  . Smokeless tobacco: Never Used  . Alcohol use No   family history includes Alcohol abuse in her maternal grandfather; Cancer in her maternal aunt, maternal aunt, maternal grandmother, maternal uncle, and mother; Depression in her sister; Diabetes in her father; Multiple sclerosis in her sister.  Review of Systems  Constitutional: Positive for weight loss. Negative for chills and fever.  Cardiovascular: Positive for palpitations.  Gastrointestinal: Negative for  abdominal pain, nausea and vomiting.  Genitourinary: Positive for frequency. Negative for flank pain, hematuria and urgency.  Musculoskeletal: Positive for back pain.       "arm swelling"  Skin: Positive for rash (oral thrush).     Medications: Current Outpatient Prescriptions  Medication Sig Dispense Refill  . dicyclomine (BENTYL) 10 MG capsule Take 1 capsule (10 mg total) by mouth 2 (two) times daily with a meal. 60 capsule 0  . ipratropium (ATROVENT) 0.06 % nasal spray Place 2 sprays into both nostrils every 4 (four) hours as needed for rhinitis. 10 mL 6  . norgestrel-ethinyl estradiol (LOW-OGESTREL) 0.3-30 MG-MCG tablet TK 1 T PO  D     No current facility-administered medications for this visit.    Allergies  Allergen Reactions  . Amoxicillin Anaphylaxis    Make feel tightness of throat and diarrhea and vomiting.   . Erythromycin Nausea And Vomiting  . Azithromycin     Nausea and vomiting.        Objective:  BP 109/75   Pulse 76   Temp 98.7 F (37.1 C) (Oral)   Wt 283 lb (128.4 kg)   LMP  (LMP Unknown)   BMI 44.32 kg/m  Gen: well-groomed, cooperative, not ill-appearing, no distress HEENT: normal conjunctiva, oropharynx clear, moist mucus membranes, no lesions  on buccal mucosa or palate, tongue has scant amount of white plaque Pulm: Normal work of breathing, normal phonation, clear to auscultation bilaterally, no wheezes, rales or rhonchi CV: Normal rate, regular rhythm, s1 and s2 distinct, no murmurs, clicks or rubs  GI: soft, obese, nondistended, nontender, no CVA tenderness Neuro: alert and oriented x 3, EOM's intact, DTR's intact Lymph: no cervical or tonsillar adenopathy Skin: warm and dry, no rashes or lesions on exposed skin, no cyanosis Psych: patient became tearful during the visit, depressed mood, normal speech and thought content  Urinalysis    Component Value Date/Time   BILIRUBINUR moderate 01/03/2017 1006   PROTEINUR 30 01/03/2017 1006    UROBILINOGEN 0.2 01/03/2017 1006   NITRITE negative 01/03/2017 1006   LEUKOCYTESUR Negative 01/03/2017 1006     Assessment and Plan: 36 y.o. female with   Urinary frequency - UA negative for acute infection today. Did appear mildly dehydrated and specific gravity was elevated. Culture pending.  - interstitial cystitis is on the differential. If symptoms do not resolve, plan to send to Urology  Oral candidiasis, improving - magic mouthwash w/lidocaine SOLN; Swish, gargle and spit.  Dispense: 500 mL; Refill: 1  Physically well but worried - addressed patient's concerns. provided a lot of reassurance. Checking basic labs to provide further reassurance. - I think patient would benefit from regularly scheduled visits  - CBC with Differential/Platelet - Comprehensive metabolic panel  Patient education and anticipatory guidance given Patient agrees with treatment plan Follow-up in 1-2 weeks  Darlyne Russian PA-C

## 2017-01-03 NOTE — Progress Notes (Deleted)
HPI:                                                                Kayla Meza is a 36 y.o. female who presents to [facility] today to establish care ***  Current Concerns include ***  Health Maintenance Health Maintenance  Topic Date Due  . TETANUS/TDAP  04/22/2000  . INFLUENZA VACCINE  07/09/2016  . PAP SMEAR  10/30/2018  . COLONOSCOPY  02/15/2021  . HIV Screening  Completed    GYN/Sexual Health  Menstrual status:   LMP:  Menses:  Last pap smear:   History of abnormal pap smears:  Sexually active:  Current contraception:  Health Habits  Diet:  Exercise:  ETOH:  Tobacco:  Drugs:  Dental Exam:  Eye Exam:  Past Medical History:  Diagnosis Date  . Esophagitis   . Fatty liver   . Gastric ulcer   . Hepatitis   . Immune deficiency disorder [facility])    Past Surgical History:  Procedure Laterality Date  . MOUTH SURGERY  2013   Social History  Substance Use Topics  . Smoking status: Former Research scientist (life sciences)  . Smokeless tobacco: Never Used  . Alcohol use No   family history includes Alcohol abuse in her maternal grandfather; Cancer in her maternal aunt, maternal aunt, maternal grandmother, maternal uncle, and mother; Depression in her sister; Diabetes in her father; Multiple sclerosis in her sister.  ROS: negative except as noted in the HPI  Medications: Current Outpatient Prescriptions  Medication Sig Dispense Refill  . dicyclomine (BENTYL) 10 MG capsule Take 1 capsule (10 mg total) by mouth 2 (two) times daily with a meal. 60 capsule 0  . ipratropium (ATROVENT) 0.06 % nasal spray Place 2 sprays into both nostrils every 4 (four) hours as needed for rhinitis. 10 mL 6  . norgestrel-ethinyl estradiol (LOW-OGESTREL) 0.3-30 MG-MCG tablet TK 1 T PO  D     No current facility-administered medications for this visit.    Allergies  Allergen Reactions  . Amoxicillin Anaphylaxis    Make feel tightness of throat  and diarrhea and vomiting.   . Erythromycin Nausea And Vomiting  . Azithromycin     Nausea and vomiting.        Objective:  LMP  (LMP Unknown)  Gen: well-groomed, cooperative, not ill-appearing, no distress HEENT: normal conjunctiva, TM's clear, oropharynx clear, moist mucus membranes, no thyromegaly or tenderness Pulm: Normal work of breathing, clear to auscultation bilaterally CV: Normal rate, regular rhythm, s1 and s2 distinct, no murmurs, clicks or rubs appreciated on this exam, no carotid bruit GI: soft, nondistended, nontender, no masses Neuro: alert and oriented x 3, EOM's intact, PERRLA, DTR's intact MSK: strength 5/5 and symmetric, normal gait, distal pulses intact, no peripheral edema Skin: warm and dry, no rashes or lesions on exposed skin Psych: normal affect, pleasant mood, normal speech and thought content   No results found for this or any previous visit (from the past 72 hour(s)). US Thyroid  Result Date: 12/30/2016 CLINICAL DATA:  6 mm right thyroid nodule by outside CT EXAM: THYROID ULTRASOUND TECHNIQUE: Ultrasound examination of the thyroid gland and adjacent soft tissues was performed. COMPARISON:  None available FINDINGS: Parenchymal Echotexture: Mildly heterogenous Isthmus: 3 mm Right  lobe: 5.1 x 1.6 x 1.7 cm Left lobe: 4.7 x 1.1 x 1.8 cm _________________________________________________________ Estimated total number of nodules >/= 1 cm: 1 Number of spongiform nodules >/=  2 cm not described below (TR1): 0 Number of mixed cystic and solid nodules >/= 1.5 cm not described below (TR2): 0 Nodule # 1: Location: Right; Superior Maximum size: 1.2 cm; Other 2 dimensions: 0.7 x 0.8 cm Composition: solid/almost completely solid (2) Echogenicity: hypoechoic (2) Shape: not taller-than-wide (0) Margins: smooth (0) Echogenic foci: none (0) ACR TI-RADS total points: 4. ACR TI-RADS risk category: TR4 (4-6 points). ACR TI-RADS recommendations: *Given size (>/= 1 - 1.4 cm) and  appearance, a follow-up ultrasound in 1 year should be considered based on TI-RADS criteria. There is an additional small partially cystic right midpole nodule measuring only 4 mm also noted. This does not meet criteria for additional follow-up or biopsy. Mildly prominent left cervical lymph node adjacent to the left jugular vein measures 9 mm in short axis. IMPRESSION: 1.2 cm right superior hypoechoic solid thyroid nodule (TR 4 nodule). This nodule meets criteria for follow-up at 1 year. The above is in keeping with the ACR TI-RADS recommendations - J Am Coll Radiol 2017;14:587-595. Electronically Signed   By: Jerilynn Mages.  Shick M.D.   On: 12/30/2016 11:24      Assessment and Plan: 36 y.o. female with ***   No orders of the defined types were placed in this encounter.    Patient education and anticipatory guidance given Patient agrees with treatment plan Follow-up *** or sooner as needed  Darlyne Russian PA-C

## 2017-01-04 LAB — COMPREHENSIVE METABOLIC PANEL
ALT: 12 U/L (ref 6–29)
AST: 16 U/L (ref 10–30)
Albumin: 4.2 g/dL (ref 3.6–5.1)
Alkaline Phosphatase: 70 U/L (ref 33–115)
BUN: 9 mg/dL (ref 7–25)
CO2: 23 mmol/L (ref 20–31)
CREATININE: 0.69 mg/dL (ref 0.50–1.10)
Calcium: 9.6 mg/dL (ref 8.6–10.2)
Chloride: 102 mmol/L (ref 98–110)
GLUCOSE: 92 mg/dL (ref 65–99)
Potassium: 4.7 mmol/L (ref 3.5–5.3)
SODIUM: 139 mmol/L (ref 135–146)
Total Bilirubin: 0.5 mg/dL (ref 0.2–1.2)
Total Protein: 7.5 g/dL (ref 6.1–8.1)

## 2017-01-05 ENCOUNTER — Encounter: Payer: Self-pay | Admitting: Physician Assistant

## 2017-01-05 LAB — URINE CULTURE: Organism ID, Bacteria: NO GROWTH

## 2017-01-06 ENCOUNTER — Other Ambulatory Visit: Payer: Self-pay | Admitting: Physician Assistant

## 2017-01-06 DIAGNOSIS — R300 Dysuria: Secondary | ICD-10-CM

## 2017-01-13 ENCOUNTER — Ambulatory Visit: Payer: BLUE CROSS/BLUE SHIELD | Admitting: Physician Assistant

## 2017-01-15 ENCOUNTER — Encounter: Payer: Self-pay | Admitting: Physician Assistant

## 2017-01-15 ENCOUNTER — Ambulatory Visit (INDEPENDENT_AMBULATORY_CARE_PROVIDER_SITE_OTHER): Payer: BLUE CROSS/BLUE SHIELD | Admitting: Physician Assistant

## 2017-01-15 VITALS — BP 118/88 | HR 60 | Wt 289.0 lb

## 2017-01-15 DIAGNOSIS — J358 Other chronic diseases of tonsils and adenoids: Secondary | ICD-10-CM

## 2017-01-15 DIAGNOSIS — M1711 Unilateral primary osteoarthritis, right knee: Secondary | ICD-10-CM | POA: Diagnosis not present

## 2017-01-15 DIAGNOSIS — R822 Biliuria: Secondary | ICD-10-CM

## 2017-01-15 DIAGNOSIS — IMO0001 Reserved for inherently not codable concepts without codable children: Secondary | ICD-10-CM | POA: Insufficient documentation

## 2017-01-15 DIAGNOSIS — M62830 Muscle spasm of back: Secondary | ICD-10-CM

## 2017-01-15 DIAGNOSIS — Q998 Other specified chromosome abnormalities: Secondary | ICD-10-CM | POA: Diagnosis not present

## 2017-01-15 MED ORDER — MELOXICAM 15 MG PO TABS: 15.0000 mg | ORAL_TABLET | Freq: Every day | ORAL | 0 refills | Status: DC

## 2017-01-15 NOTE — Patient Instructions (Addendum)
Start Meloxicam 15mg  (1 tab) daily with food (do not take any other OTC pain relievers. Tylenol is okay) I placed a referral to Physical Therapy. They will contact you to schedule an appt If no improvement in 3 months, make an appt. To see Sports Medicine, Dr. Dianah Field  Start prune juice diluted 50% with water and a daily stool softener (colace) for constipation  Continue to self-clean your tonsil stones. Make sure you have 2 dental cleanings annually  I will call you with the results of your urinalysis  Come back and see me in 1 month :)

## 2017-01-15 NOTE — Progress Notes (Signed)
HPI:                                                                Kayla Meza is a 36 y.o. female who presents to [facility] [place] today for follow-up  She is accompanied by her young daughter today  Current concerns today include back pain/flank pain, right knee pain, and tonsil stones  I am closely following this patient on a monthly basis with history of multiple somatic complaints. She was previously being seen by Iran Planas PA-C in our practice and requested to switch primary care to me.   Patient has had a lot of specialist work-ups, but continues to be concerned and have anxiety about her symptoms. She is wondering if it is possible if she has multiple sclerosis because her sister has MS. - Stool studies 01/05/17 cx and C. Diff negative - Urine cx 01/03/17 negative - Thyroid US 12/30/16 nodules 1 yr followup - Labs 12/19/16 CBC, CMP, TSH, lipase normal - Cspine XR 12/18/16 negative - Lt foot XR 11/19/16 negative - Colonoscopy 02/2016 polyps on 29yrrecall - EGD 02/2016 gastritis  - CT 10/2016 renal cyst  She states that her oral candidiasis has improved since her last visit. She states she still has occasional "orange patches" on her tongue and would like them to be sampled today.   She states dysuria has resolved, but she is worried that it will recur. [facility].   She was seen by GI this week for f/u hematochezia. She is taking probiotics for "loose stools/undigested food."   She is also scheduled to see endocrinology for thyroid nodule follow-up on 04/22/17.    Past Medical History:  Diagnosis Date  . Arthritis   . Fatty liver   . Gastric ulcer   . GERD (gastroesophageal reflux disease)   . Multiple thyroid nodules 2018  . Palpitations   . Preeclampsia   . Tubular adenoma of colon 02/16/2016   Past Surgical History:  Procedure Laterality Date  .  CHOLECYSTECTOMY    . LIVER BIOPSY    . MOUTH SURGERY  2013  . TUBAL LIGATION     Social History  Substance Use Topics  . Smoking status: Former Smoker    Types: Cigarettes    Quit date: 04/08/2009  . Smokeless tobacco: Never Used     Comment: 1 pack per month  . Alcohol use No   family history includes Alcohol abuse in her maternal grandfather; Cancer in her maternal aunt, maternal aunt, maternal grandmother, maternal uncle, and mother; Depression in her sister; Diabetes in her father; Multiple sclerosis in her sister.  Review of Systems  Constitutional: Negative for chills, fever and weight loss.  HENT: Positive for sore throat (tonsil stones).   Eyes: Positive for blurred vision (right eye).  Respiratory: Negative for cough.   Cardiovascular: Negative for chest pain and palpitations.  Gastrointestinal: Positive for abdominal pain (RUQ). Negative for blood in stool, constipation, diarrhea and heartburn.       "undigested food"  Genitourinary: Positive for flank pain.  Musculoskeletal: Positive for back pain, joint pain (right knee, left foot) and myalgias. Negative for falls.  Skin: Negative for rash.  Neurological: Negative for focal weakness and weakness.  Psychiatric/Behavioral: Negative.      Medications: Current Outpatient Prescriptions  Medication Sig Dispense Refill  . nystatin (MYCOSTATIN) 100000 UNIT/ML suspension TK 5 ML PO QID  0  . Probiotic Product (PROBIOTIC ADVANCED PO) Take by mouth.    . meloxicam (MOBIC) 15 MG tablet Take 1 tablet (15 mg total) by mouth daily. 30 tablet 0   No current facility-administered medications for this visit.    Allergies  Allergen Reactions  . Amoxicillin Anaphylaxis    Make feel tightness of throat and diarrhea and vomiting.   . Erythromycin Nausea And Vomiting  . Azithromycin     Nausea and vomiting.        Objective:  BP 118/88   Pulse 60   Wt 289 lb (131.1 kg)   LMP  (LMP Unknown)   BMI 45.26 kg/m  Gen:  well-groomed, obese, cooperative, not ill-appearing, no distress Mouth/Throat: poor dentition, oropharynx clear, no lesions on buccal mucosa, palate or tongue;  moist mucus membranes, multiple crypts in mildly enlarged tonsils bilaterally, no exudates Pulm: Normal work of breathing, normal phonation, clear to auscultation bilaterally, no wheezes, rales or rhonchi CV: Normal rate, regular rhythm, s1 and s2 distinct, no murmurs, clicks or rubs  Extremities: non-pitting peripheral edema in bilateral lower extremities, no varicosities, negative Homan's sign; left knee- without effusion, full active ROM, no bony tenderness or crepitus Back: no spinous process tenderness or step-offs, left-sided tenderness at mid-thoracic level, full active ROM Neuro: alert and oriented x 3, DTR's intact Lymph: there is mild tonsillar adenopathy bilaterally, no cervical or supraclavicular adenopathy Skin: warm and dry, no rashes or lesions on exposed skin, no cyanosis Psych: appropriate affect, pleasant mood, normal speech and thought content  Lab Results  Component Value Date   WBC 8.7 01/03/2017   HGB 12.9 01/03/2017   HCT 40.5 01/03/2017   MCV 81.5 01/03/2017   PLT 213 01/03/2017   Lab Results  Component Value Date   NA 139 01/03/2017   K 4.7 01/03/2017   CL 102 01/03/2017   CO2 23 01/03/2017   Lab Results  Component Value Date   ALT 12 01/03/2017   AST 16 01/03/2017   ALKPHOS 70 01/03/2017   BILITOT 0.5 01/03/2017   CLINICAL DATA:  Left knee pain  EXAM: RIGHT KNEE - 1-2 VIEW  COMPARISON:  None.  FINDINGS: No acute fracture. No dislocation. Moderate tricompartment osteoarthritic change.  IMPRESSION: No acute bony pathology.  Degenerative change.   Electronically Signed   By: Marybelle Killings M.D.   On: 02/26/2016 11:53  Assessment and Plan: 36 y.o. female with   1. Osteoarthritis of right knee, unspecified osteoarthritis type - meloxicam (MOBIC) 15 MG tablet; Take 1 tablet (15  mg total) by mouth daily.  Dispense: 30 tablet; Refill: 0 - Ambulatory referral to Physical Therapy - if no improvement with conservative management in 3 months, follow-up with Sports Medicine for possible ultrasound-guided injection  2. Tonsil stone - continue self-cleaning - recommended dental cleanings bi-annually - discussed that we will refer to ENT if they become more problematic, but that these procedures can be painful  3. Flank pain - unlikely this is renal in the setting of recent negative UA and culture, no urinary symptoms or constitutional symptoms. I think this is more musculoskeletal back pain or fibromyalgia - Urine Microscopic pending  4. Erythrocyte sedimentation rate (ESR) greater than or equal to 20 mm per hour - in reviewing patient's labs noted an  ESR of 32 and CRP of 16.1 from 08/19/16 - plan to recheck at follow-up  5. Paraspinal muscle spasm - symptomatic management with ice/heat and NSAIDs  I spent time reassuring the patient that I did not think her symptoms were related to one disease process or indicative of multiple sclerosis, but that we would note it in her family hx  Patient education and anticipatory guidance given Patient agrees with treatment plan Follow-up in 1 month or sooner as needed  Darlyne Russian PA-C

## 2017-01-16 ENCOUNTER — Telehealth: Payer: Self-pay | Admitting: Physician Assistant

## 2017-01-16 ENCOUNTER — Encounter: Payer: Self-pay | Admitting: Physician Assistant

## 2017-01-16 DIAGNOSIS — M791 Myalgia, unspecified site: Secondary | ICD-10-CM

## 2017-01-16 DIAGNOSIS — R700 Elevated erythrocyte sedimentation rate: Secondary | ICD-10-CM

## 2017-01-16 NOTE — Telephone Encounter (Signed)
Patient continues to complain of myalgias. She has a history of an elevated sed rate. I discussed this with Dr. Dianah Field and he suggested an initial rheum work-up to rule out polymyalgia rheumatica. If ESR elevation persists, plan to start Prednisone '10mg'$  daily.  Lab Results  Component Value Date   ESRSEDRATE 90 (H) 08/19/2016

## 2017-01-17 ENCOUNTER — Ambulatory Visit: Payer: BLUE CROSS/BLUE SHIELD | Admitting: Sports Medicine

## 2017-01-20 ENCOUNTER — Ambulatory Visit (INDEPENDENT_AMBULATORY_CARE_PROVIDER_SITE_OTHER): Payer: BLUE CROSS/BLUE SHIELD | Admitting: Internal Medicine

## 2017-01-20 ENCOUNTER — Telehealth: Payer: Self-pay | Admitting: Physician Assistant

## 2017-01-20 ENCOUNTER — Encounter: Payer: Self-pay | Admitting: Internal Medicine

## 2017-01-20 VITALS — BP 140/80 | HR 84 | Ht 67.0 in | Wt 292.0 lb

## 2017-01-20 DIAGNOSIS — R918 Other nonspecific abnormal finding of lung field: Secondary | ICD-10-CM | POA: Diagnosis not present

## 2017-01-20 LAB — URINALYSIS, MICROSCOPIC ONLY: YEAST: NONE SEEN [HPF]

## 2017-01-20 NOTE — Patient Instructions (Signed)
We will do a CT without contrast in 06/18/17 at [facility] on [address] per the [employer] guidelines and call you with the results > call us in the meantime for pain with breathing, coughing up blood or difficulty with breathing

## 2017-01-20 NOTE — Assessment & Plan Note (Signed)
Body mass index is 45.73 kg/m.  trending up  Lab Results  Component Value Date   TSH 1.62 12/24/2016     Contributing to gerd risk/ doe/reviewed the need and the process to achieve and maintain neg calorie balance > defer f/u primary care including intermittently monitoring thyroid status

## 2017-01-20 NOTE — Progress Notes (Signed)
Subjective:     Patient ID: Kayla Meza, female   DOB: 07-31-1981,    MRN: [id number]  HPI  29 yowf  Quit smoking  04/08/09 with incidental nodule x 6 mm in RLL in Aug 2017 then had cp's in Jan 2018 R side > CTangiogram neg clot, pos for the same RLL nodule nodule s change and a new one x 4 mm referred to pulmonary clinic 01/20/2017 by   Phillip Heal    01/20/2017 1st [facility] office visit/ Kayla Meza   Chief Complaint  Patient presents with  . Pulmonary Consult    Referred by Dr. Hollie Salk for eval of pulmonary nodule.   no first degree contacts but smokers on both sides have had lung ca  No symptoms at all now/ R sided cp was fleeting and has not recurred   Not limited by breathing from desired activities    No obvious day to day or daytime variability or assoc excess/ purulent sputum or mucus plugs or hemoptysis or cp or chest tightness, subjective wheeze or overt sinus or hb symptoms. No unusual exp hx or h/o childhood pna/ asthma or knowledge of premature birth.  Sleeping ok without nocturnal  or early am exacerbation  of respiratory  c/o's or need for noct saba. Also denies any obvious fluctuation of symptoms with weather or environmental changes or other aggravating or alleviating factors except as outlined above   Current Medications, Allergies, Complete Past Medical History, Past Surgical History, Family History, and Social History were reviewed in [employer] record.  ROS  The following are not active complaints unless bolded sore throat, dysphagia, dental problems, itching, sneezing,  nasal congestion or excess/ purulent secretions, ear ache,   fever, chills, sweats, unintended wt loss, classically pleuritic or exertional cp,  orthopnea pnd or leg swelling, presyncope, palpitations, abdominal pain, anorexia, nausea, vomiting, diarrhea  or change in bowel or bladder habits, change in stools or urine, dysuria,hematuria,  rash, arthralgias, visual complaints,  headache, numbness, weakness or ataxia or problems with walking or coordination,  change in mood/affect or memory.              Review of Systems     Objective:   Physical Exam    amb obese anxious wf nad  Wt Readings from Last 3 Encounters:  01/20/17 292 lb (132.5 kg)  01/15/17 289 lb (131.1 kg)  01/03/17 283 lb (128.4 kg)    Vital signs reviewed - Note on arrival 02 sats  100% on RA  HEENT: nl dentition, turbinates, and oropharynx. Nl external ear canals without cough reflex   NECK :  without JVD/Nodes/TM/ nl carotid upstrokes bilaterally   LUNGS: no acc muscle use,  Nl contour chest which is clear to A and P bilaterally without cough on insp or exp maneuvers   CV:  RRR  no s3 or murmur or increase in P2, nad no edema   ABD:  Quite obese/ soft and nontender with nl inspiratory excursion in the supine position. No bruits or organomegaly appreciated, bowel sounds nl  MS:  Nl gait/ ext warm without deformities, calf tenderness, cyanosis or clubbing No obvious joint restrictions   SKIN: warm and dry without lesions    NEURO:  alert, approp, nl sensorium with  no motor or cerebellar deficits apparent.     I personally reviewed images and agree with radiology impression as follows:  CT chest 12/19/16   Nonspecific pulmonary nodules in the right lower lobe measuring up to 6  mm. Recommend follow-up as per Fleischner guidelines  > 6 months since former smoker quit 2010     Assessment:

## 2017-01-20 NOTE — Telephone Encounter (Signed)
Hi [name], I'm not sure what happened, but let her know that she can either wait until her appointment with urology or I can re-order the test and she can have it performed in the lab.

## 2017-01-20 NOTE — Assessment & Plan Note (Signed)
CT chest 12/19/16 Nonspecific pulmonary nodules in the right lower lobe measuring up to 6 mm. Recommend follow-up as per Fleischner guidelines  > 6 months since former smoker quit 2010   CT results reviewed with pt >>> Too small for PET or bx, not suspicious enough for excisional bx > really only option for now is follow the Fleischner society guidelines as rec by radiology. Since she has several distant relatives with lung ca/ quit smoking 7 y prior to OV  Reasonable to do the f/u at 6 months   Discussed in detail all the  indications, usual  risks and alternatives  relative to the benefits with patient who agrees to proceed with conservative f/u as outlined   Total time devoted to counseling  > 50 % of initial  45  min office visit:  review case with pt/ discussion of options/alternatives/ personally creating written customized instructions  in presence of pt  then going over those specific  Instructions directly with the pt including how to use all of the meds but in particular covering each new medication in detail and the difference between the maintenance= "automatic" meds and the prns using an action plan format for the latter (If this problem/symptom => do that organization reading Left to right).  Please see AVS from this visit for a full list of these instructions which I personally wrote for this pt and  are unique to this visit.

## 2017-01-20 NOTE — Telephone Encounter (Signed)
Was advised via [facility] the urine microscopic could not be completed. It was sent in a urine cup rather than the appropriate urine tube. Will route to PCP to see if Pt needs to go to lab for recollection.

## 2017-01-20 NOTE — Telephone Encounter (Signed)
Pt advised, she will wait until her urology appt next week.

## 2017-01-22 ENCOUNTER — Ambulatory Visit (INDEPENDENT_AMBULATORY_CARE_PROVIDER_SITE_OTHER): Payer: BLUE CROSS/BLUE SHIELD | Admitting: Physical Therapy

## 2017-01-22 ENCOUNTER — Ambulatory Visit: Payer: BLUE CROSS/BLUE SHIELD | Admitting: Family Medicine

## 2017-01-22 ENCOUNTER — Encounter: Payer: Self-pay | Admitting: Physical Therapy

## 2017-01-22 DIAGNOSIS — M545 Low back pain, unspecified: Secondary | ICD-10-CM

## 2017-01-22 DIAGNOSIS — M25561 Pain in right knee: Secondary | ICD-10-CM | POA: Diagnosis not present

## 2017-01-22 DIAGNOSIS — M6281 Muscle weakness (generalized): Secondary | ICD-10-CM | POA: Diagnosis not present

## 2017-01-22 NOTE — Patient Instructions (Addendum)
Quads / HF, Prone    Lie face down, knees together. Grasp one ankle with same-side hand. Use towel if needed to reach. Gently pull foot toward buttock. Hold _45__ seconds. Repeat _2__ times per session. Do __1_ sessions per day.   Leg Lift: One-Leg    Press pelvis down. Keep knee straight; lengthen and lift one leg (from waist). Do not twist body. Keep other leg down. Hold __1_ seconds. Relax. Repeat 10-20 time. Repeat with other leg.  Side Kick    Lie on side, back straight along edge of mat, legs 30 in front of torso. Lift top leg to hip height, foot flexed. Exhale, kicking forward twice. Inhale, kicking once backward with pointed foot. Keep leg hip height, torso still. Repeat __10-20__ times. Repeat on other side. Do _1___ sessions per day.  Side Leg Circle    Lie on side, back straight along edge of mat, legs 30 in front of torso. Lift top leg to hip height. Rotate in small circle, __10__ times in each direction. Repeat _10-20___ times. Repeat on other side. Do _1___ sessions per day. Repeat on the other side.   Strengthening: Straight Leg Raise (Phase 1)     Tighten muscles on front of right thigh, then lift leg _12-16___ inches from surface, keeping knee locked.  Repeat __10__ times per set. Do ___3_ sets per session. Do __1__ sessions per day. Copyright  VHI. All rights reserved.
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
## 2017-01-28 ENCOUNTER — Ambulatory Visit (INDEPENDENT_AMBULATORY_CARE_PROVIDER_SITE_OTHER): Payer: BLUE CROSS/BLUE SHIELD | Admitting: Physical Therapy

## 2017-01-28 DIAGNOSIS — M545 Low back pain, unspecified: Secondary | ICD-10-CM

## 2017-01-28 DIAGNOSIS — M6281 Muscle weakness (generalized): Secondary | ICD-10-CM | POA: Diagnosis not present

## 2017-01-28 DIAGNOSIS — M25561 Pain in right knee: Secondary | ICD-10-CM | POA: Diagnosis not present

## 2017-01-28 NOTE — Patient Instructions (Signed)
Achilles / Gastroc, Standing    Stand, right foot behind, heel on floor and turned slightly out, leg straight, forward leg bent. Move hips forward. Hold _30-45__ seconds. Repeat __1_ times per session. Do __1_ sessions per day.  Copyright  VHI. All rights reserved.

## 2017-01-28 NOTE — Therapy (Addendum)
[facility] [place] [address] [place], [place], [address] Phone: [phone]   Fax:  [phone]  Physical Therapy Treatment  Patient Details  Name: Kayla Meza MRN: [id number] Date of Birth: [birth year] Referring Provider: Gena Fray PA  Encounter Date: 01/28/2017      PT End of Session - 01/28/17 1014    Visit Number 2   Number of Visits 8   Date for PT Re-Evaluation 02/19/17   PT Start Time 1014   PT Stop Time 1101   PT Time Calculation (min) 47 min   Activity Tolerance Patient tolerated treatment well      Past Medical History:  Diagnosis Date  . Arthritis   . Fatty liver   . Gastric ulcer   . GERD (gastroesophageal reflux disease)   . Multiple thyroid nodules 2018  . Palpitations   . Preeclampsia   . Tubular adenoma of colon 02/16/2016    Past Surgical History:  Procedure Laterality Date  . CHOLECYSTECTOMY    . LIVER BIOPSY    . MOUTH SURGERY  2013  . TUBAL LIGATION      There were no vitals filed for this visit.      Subjective Assessment - 01/28/17 1016    Subjective Kayla Meza reports the knee pain is somewhat better however she has a pulling sensation in her Rt calf, not really having back pain however it moved into her groin. Checked by OBGYN and not an issue for them sees urologist tomorrow.    Currently in Pain? Yes   Pain Score 2   7/10   Pain Location Calf   Pain Orientation Right;Proximal   Pain Descriptors / Indicators Tightness   Pain Type Acute pain   Pain Onset In the past 7 days   Aggravating Factors  walking   Pain Relieving Factors nothing                         [facility] Adult PT Treatment/Exercise - 01/28/17 0001      Lumbar Exercises: Stretches   Passive Hamstring Stretch Limitations gastroc stretch Rt      Lumbar Exercises: Standing   Other Standing Lumbar Exercises SLS Rt with FWD leans, one hand assist     Lumbar Exercises: Supine   Bridge 10 reps   each, regular, feet together with clams, then with feet ER   Isometric Hip Flexion 10 reps;5 seconds   Other Supine Lumbar Exercises 10x10sec Rt quad sets   Other Supine Lumbar Exercises 10 reps SLR with TA contractions and shoiulder horizontal abduct with red band     Lumbar Exercises: Sidelying   Other Sidelying Lumbar Exercises 2x10 Rt hip adduction     Modalities   Modalities Ultrasound     Ultrasound   Ultrasound Location Rt gastroc    Ultrasound Parameters 100%, 1.38mHz, 1.5w/cm2     Manual Therapy   Manual Therapy Taping;Soft tissue mobilization   Soft tissue mobilization Rt calf STW to work out muscle spasm   Kinesiotex --  dynamic tape to Rt knee for lateral patellar tracking                PT Education - 01/28/17 1040    Education provided Yes   Education Details gastroc stretch   Methods Explanation;Demonstration;Handout   Comprehension Returned demonstration;Verbalized understanding             PT Long Term Goals - 01/28/17 1058      PT  LONG TERM GOAL #1   Title I with advanced HEP ( 02/19/17)    Status On-going     PT LONG TERM GOAL #2   Title increase Rt LE strength =/> 5-/5 ( 02/19/14)    Status On-going     PT LONG TERM GOAL #3   Title perform SLS Rt =/> 12 sec without knee pain ( 02/19/17)    Status On-going     PT LONG TERM GOAL #4   Title improve Rt prone quad flexibility to within in 5 degrees of Lt ( 02/19/17)    Status On-going     PT LONG TERM GOAL #5   Title report back and knee pain reduction =/> 75% with walking ( 02/19/17)    Status On-going     PT LONG TERM GOAL #6   Title improve FOTO =/> 37% limited ( 02/19/17)    Status On-going               Plan - 01/28/17 1058    Clinical Impression Statement this is Kayla Meza's second visit, no goals met.  She continues with diffuse Rt sided body pain and issues.  Today she presented with antalgic gait, Rt LE turned out.  This resolved after treatment.  She has many concerns and  questions regarding her body and issues.  She conitues to be very weak in her core and Rt side.    Rehab Potential Good   PT Frequency 2x / week   PT Duration 4 weeks   PT Treatment/Interventions Moist Heat;Ultrasound;Traction;Therapeutic exercise;Dry needling;Taping;Vasopneumatic Device;Manual techniques;Neuromuscular re-education;Cryotherapy;Electrical Stimulation;Iontophoresis '4mg'$ /ml Dexamethasone;Patient/family education   PT Next Visit Plan core and Rt hip strengthening    Consulted and Agree with Plan of Care Patient      Patient will benefit from skilled therapeutic intervention in order to improve the following deficits and impairments:  Decreased strength, Pain, Impaired perceived functional ability, Increased muscle spasms, Decreased balance  Visit Diagnosis: Acute bilateral low back pain without sciatica  Acute pain of right knee  Muscle weakness (generalized)     Problem List Patient Active Problem List   Diagnosis Date Noted  . Morbid (severe) obesity due to excess calories ([place]) 01/20/2017  . Tonsil stone 01/15/2017  . Erythrocyte sedimentation rate (ESR) greater than or equal to 20 mm per hour 01/15/2017  . Change in stool 12/26/2016  . Pulmonary nodules 12/26/2016  . Thyroid nodule 12/26/2016  . Cervical paraspinal muscle spasm 12/18/2016  . Left foot pain 11/19/2016  . Metatarsalgia of left foot 11/19/2016  . Plantar fasciitis 11/19/2016  . Biliary dyskinesia 09/13/2016  . Palpitations 03/26/2016  . Knee pain 02/26/2016  . Pain in joint, pelvic region and thigh 02/26/2016  . 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 Adenomatous polyp of colon 02/23/2016  . Heel pain 02/19/2016  . Uterine leiomyoma 02/09/2016  . Dysmenorrhea 02/08/2016  . Chronic gastritis 01/16/2016  . Primary osteoarthritis of right knee 01/16/2016  . Abnormal liver enzymes 01/16/2016  . Right-sided chest pain 01/03/2016    Kayla Meza PT 01/28/2017, 12:40 PM  [facility] [place], [place], [address] Phone: [phone]   Fax:  [phone]  Name: Kayla Meza MRN: [id number] Date of Birth: [birth year]  PHYSICAL THERAPY DISCHARGE SUMMARY  Visits from Start of Care: 2  Current functional level related to goals / functional outcomes: unknown   Remaining deficits: unknown   Education / Equipment: HEP Plan:  Patient goals were not met. Patient is being discharged due to not returning since the last visit.  ?????     Kayla Meza, PT 03/11/17 12:36 PM

## 2017-01-29 ENCOUNTER — Encounter: Payer: Self-pay | Admitting: Physician Assistant

## 2017-01-29 LAB — CK: Total CK: 56 U/L (ref 7–177)

## 2017-01-29 LAB — CYCLIC CITRUL PEPTIDE ANTIBODY, IGG: Cyclic Citrullin Peptide Ab: <16 Units

## 2017-01-29 LAB — ANA: ANA: NEGATIVE

## 2017-01-29 LAB — C-REACTIVE PROTEIN: CRP: 3.9 mg/L (ref <8.0)

## 2017-01-29 LAB — SEDIMENTATION RATE: Sed Rate: 20 mm/hr (ref 0–20)

## 2017-01-29 LAB — RHEUMATOID FACTOR

## 2017-01-31 ENCOUNTER — Ambulatory Visit (INDEPENDENT_AMBULATORY_CARE_PROVIDER_SITE_OTHER): Payer: BLUE CROSS/BLUE SHIELD | Admitting: Physician Assistant

## 2017-01-31 VITALS — BP 114/75 | HR 77 | Wt 284.0 lb

## 2017-01-31 DIAGNOSIS — M654 Radial styloid tenosynovitis [de Quervain]: Secondary | ICD-10-CM | POA: Diagnosis not present

## 2017-01-31 DIAGNOSIS — M797 Fibromyalgia: Secondary | ICD-10-CM | POA: Diagnosis not present

## 2017-01-31 DIAGNOSIS — N3281 Overactive bladder: Secondary | ICD-10-CM | POA: Diagnosis not present

## 2017-01-31 NOTE — Progress Notes (Signed)
HPI:                                                                Kayla Meza is a 36 y.o. female who presents to [place]: [place] today for follow-up  Patient states she has right wrist pain and swelling x 2 weeks. Pain is predominantly medial and worse with abduction. Denies known injury or trauma. She currently works at [employer] and is requesting a brace. She states she is unable to tolerate NSAID's due to gastritis and states GI doctor told her to avoid them.  Patient complains of polyuria today. States she urinated twice per hour yesterday and throughout the night. She is currently taking Bactrim that was prescribed by her gynecologist for dysuria despite multiple negative urinalysis and cultures. She was seen by [facility] yesterday 2/22 and they did not feel she meets criteria for interstitial cystitis or cystoscopy.  POCT Urine Dipstick Automated w/Scope  Collection Time: 01/30/17 8:30 AM  Result Value Ref Range  POC Urine Color yellow Yellow  POC Urine Appearance clear Clear  POC Urine Glucose Negative Negative MG/DL  POC Urine Bilirubin Negative Negative  POC Urine Ketones Negative Negative mg/dL  POC Urine Specific Gravity 1.010 1.005 - 1.025  POC Urine Blood Trace (A) Negative  POC Urine pH Dipstick 6.0 4.6 - 8.0  POC Urine Protein Negative Negative mg/dL  POC Urobilinogen 0.2 0.2 - 1 mg/dL  POC Urine Nitrite Negative Negative  POC Urine Leukocytes Negative Negative  Strip Lot Number na  U White Blood Cells 0 - 5 hpf  U Red Blood Cells 0 - 3 hpf  Urine Epithelial Cells FEW hpf  U Crystals  U Bacteria RARE hpf  Mucus, UA  U Casts 0-2 hyaline lpf    Patient states she has "felt horrible" for the last year and a half. She constantly feels fatigued and has intermittent pain in her neck, back, knees, and pelvis.   Past Medical History:  Diagnosis Date  . Arthritis   . Fatty liver   . Gastric ulcer   . GERD  (gastroesophageal reflux disease)   . Multiple thyroid nodules 2018  . Palpitations   . Preeclampsia   . Tubular adenoma of colon 02/16/2016   Past Surgical History:  Procedure Laterality Date  . CHOLECYSTECTOMY    . LIVER BIOPSY    . MOUTH SURGERY  2013  . TUBAL LIGATION     Social History  Substance Use Topics  . Smoking status: Former Smoker    Packs/day: 0.25    Years: 10.00    Types: Cigarettes    Quit date: 04/08/2009  . Smokeless tobacco: Never Used  . Alcohol use No   family history includes Alcohol abuse in her maternal grandfather; Cancer in her maternal aunt, maternal aunt, maternal grandmother, maternal uncle, and mother; Depression in her sister; Diabetes in her father; Multiple sclerosis in her sister.  ROS: negative except as noted in the HPI  Medications: Current Outpatient Prescriptions  Medication Sig Dispense Refill  . Dexlansoprazole (DEXILANT) 30 MG capsule Take 30 mg by mouth daily.    . Lactobacillus (PROBIOTIC ACIDOPHILUS PO) Take 1 Can by mouth 2 (two) times daily.    Marland Kitchen PAPAYA ENZYMES PO Take 1  capsule by mouth daily.     No current facility-administered medications for this visit.    Allergies  Allergen Reactions  . Amoxicillin Anaphylaxis    Make feel tightness of throat and diarrhea and vomiting.   . Erythromycin Nausea And Vomiting  . Azithromycin     Nausea and vomiting.        Objective:  BP 114/75   Pulse 77   Wt 284 lb (128.8 kg)   BMI 44.48 kg/m  Gen: well-groomed, cooperative, not ill-appearing, no distress HEENT: normal conjunctiva, oropharynx clear, moist mucus membranes Pulm: Normal work of breathing, normal phonation, clear to auscultation bilaterally, no wheezes, rales or rhonchi CV: Normal rate, regular rhythm, s1 and s2 distinct, no murmurs, clicks or rubs GI: soft, nondistended, nontender Neuro: alert and oriented x 3, EOM's intact Right Wrist: normal appearance, full active ROM, positive Finkelstein's test,  neurovascularly intact Skin: warm and dry, no rashes or lesions on exposed skin, no cyanosis Psych: flat affect, depressed mood, becomes very tearful during exam, normal speech and though content   No results found for this or any previous visit (from the past 72 hour(s)). No results found.    Assessment and Plan: 36 y.o. female with   1. De Quervain's tenosynovitis, right - provided thumb spica splint. Patient states she will not be able to work and wear the splint. Instructed to wear the splint when she is not working. Provided ace wrap for work - daily wrist exercises - patient unable to tolerate NSAIDs - if no improvement in 2 weeks, will refer to PT  2. Overactive bladder - recommended discontinuing Bactrim to avoid adverse effects, but patient desires to complete therapy - lifestyle modifications including weight loss, avoiding caffeine and alcohol, drink small amounts of liquids throughout the day, avoid liquids before bedtime  3. Depression, Fibromyalgia - work-up for PMR negative. Patient's ESR and CRP have returned to a normal range. CCP, ANA and RF are wnl. - patient continues to deny depressed mood and refuses treatment, but she becomes tearful at every office visit and continues to seek care for vague somatic complaints. Discussed treating her pain and chronic fatigue with [facility]. She desires to research this first.  Patient education and anticipatory guidance given Patient agrees with treatment plan Follow-up in 1 week  Darlyne Russian PA-C

## 2017-01-31 NOTE — Patient Instructions (Addendum)
Do your wrist exercise daily Wear your splint when you're not at work If no improvement in 2 weeks, you will need a referral for PT  Think about and research [facility] for your pain  Come back and see me in 1 week

## 2017-02-03 ENCOUNTER — Encounter: Payer: Self-pay | Admitting: Physician Assistant

## 2017-02-04 ENCOUNTER — Encounter: Payer: BLUE CROSS/BLUE SHIELD | Admitting: Physical Therapy

## 2017-02-05 ENCOUNTER — Ambulatory Visit (INDEPENDENT_AMBULATORY_CARE_PROVIDER_SITE_OTHER): Payer: BLUE CROSS/BLUE SHIELD | Admitting: Physician Assistant

## 2017-02-05 DIAGNOSIS — M654 Radial styloid tenosynovitis [de Quervain]: Secondary | ICD-10-CM

## 2017-02-05 NOTE — Progress Notes (Signed)
Patient has been informed that thumb spica splint is the treatment for De Quervain's tenosynovitis and that this wrist splint is unlikely to improve her pain.

## 2017-02-05 NOTE — Progress Notes (Signed)
   Subjective:    Patient ID: Kayla Meza, female    DOB: 12-30-1980, 37 y.o.   MRN: [id number]  HPI Patient here to get different brace for right brace; the spica thumb component is causing pain. She has been using ace wrap and states pain level 3; does not want to take po pain medication.    Review of Systems     Objective:   Physical Exam        Assessment & Plan:  Fitted for plain brace for right wrist. Given directions for calling [facility] to return the previous brace.

## 2017-02-10 ENCOUNTER — Ambulatory Visit: Payer: BLUE CROSS/BLUE SHIELD | Admitting: Physician Assistant

## 2017-02-12 ENCOUNTER — Encounter: Payer: Self-pay | Admitting: Physician Assistant

## 2017-02-12 DIAGNOSIS — M0230 Reiter's disease, unspecified site: Secondary | ICD-10-CM | POA: Insufficient documentation

## 2017-02-24 ENCOUNTER — Encounter: Payer: Self-pay | Admitting: Family Medicine

## 2017-02-24 ENCOUNTER — Ambulatory Visit (INDEPENDENT_AMBULATORY_CARE_PROVIDER_SITE_OTHER): Payer: BLUE CROSS/BLUE SHIELD | Admitting: Family Medicine

## 2017-02-24 DIAGNOSIS — M797 Fibromyalgia: Secondary | ICD-10-CM | POA: Diagnosis not present

## 2017-02-24 DIAGNOSIS — K5790 Diverticulosis of intestine, part unspecified, without perforation or abscess without bleeding: Secondary | ICD-10-CM

## 2017-02-24 MED ORDER — DULOXETINE HCL 20 MG PO CPEP: 20.0000 mg | ORAL_CAPSULE | Freq: Every day | ORAL | 3 refills | Status: DC

## 2017-02-24 NOTE — Progress Notes (Signed)
Pre visit review using our clinic review tool, if applicable. No additional management support is needed unless otherwise documented below in the visit note. 

## 2017-02-24 NOTE — Progress Notes (Signed)
   Subjective:    Patient ID: Kayla Meza, female    DOB: 09-15-1981, 36 y.o.   MRN: [id number]  HPI New to establish.  Previous MD- [place] Planas, Nelson Chimes    Frequent illness- 'I feel like death'.  Occurring every 3-4 months.  + fatigue, abd pain, 'complete pain through my entire body'.  Had gallbladder removed, tubal ligation and liver bx at same time.  'felt wonderful for about a month'.  Then developed severe constipation 'i didn't use the bathroom for about a month'.  Pain recurred.  Pt was prescribed bactrim which improved sxs x2.  Was referred to urology.  Urology felt sxs are GI in nature.  Sees Dr Howell Rucks at [facility] in [place].  He has her on probiotics, miralax, and prescribed bentyl but pt is fearful to take medication b/c she read a side effect can cause vision issues.  Pt reports that she will develop joint pain and swelling- 'horrible pain and I can't even walk'.  Pt reports she has been to ~20 doctors in 'past year and a half' w/o any insight into cause of sxs.  Review of Systems For ROS see HPI     Objective:   Physical Exam  Constitutional: She is oriented to person, place, and time. She appears well-developed and well-nourished. No distress.  obese  HENT:  Head: Normocephalic and atraumatic.  Eyes: Conjunctivae and EOM are normal. Pupils are equal, round, and reactive to light.  Neck: Normal range of motion. Neck supple. No thyromegaly present.  Cardiovascular: Normal rate, regular rhythm, normal heart sounds and intact distal pulses.   No murmur heard. Pulmonary/Chest: Effort normal and breath sounds normal. No respiratory distress.  Abdominal: Soft. She exhibits no distension. There is no tenderness.  Musculoskeletal: She exhibits no edema.  Lymphadenopathy:    She has no cervical adenopathy.  Neurological: She is alert and oriented to person, place, and time.  Skin: Skin is warm and dry.  Psychiatric: She has a normal mood and affect. Her  behavior is normal.  Vitals reviewed.         Assessment & Plan:

## 2017-02-24 NOTE — Patient Instructions (Signed)
Follow up in 4-6 weeks to recheck pain and mood Start the Cymbalta 20mg  daily to help w/ pain Based on the description, it sounds like you might be having recurrent diverticulitis when you are having abdominal pain Try and get regular exercise/activity- this will help your pain Drink plenty of water Try and get adequate sleep Call with any questions or concerns Hang in there!!!

## 2017-02-27 DIAGNOSIS — K5790 Diverticulosis of intestine, part unspecified, without perforation or abscess without bleeding: Secondary | ICD-10-CM | POA: Insufficient documentation

## 2017-02-27 NOTE — Assessment & Plan Note (Signed)
New.  Pt has known diverticulosis based on colonoscopy.  It sounds as if her 'recurrent infections' are not urinary in nature at all- based on negative urine cultures- but rather may be diverticulitis.  Encouraged her to follow up w/ GI next time she has sxs for complete evaluation.  Pt expressed understanding and is in agreement w/ plan.

## 2017-02-27 NOTE — Assessment & Plan Note (Signed)
New.  Pt's sxs are consistent w/ Fibromyalgia.  Given her diffuse pain, will start Cymbalta and monitor for improvement.  Will follow.

## 2017-02-27 NOTE — Assessment & Plan Note (Signed)
New to provider, ongoing for pt.  Stressed the need for healthy diet and regular exercise.  Reviewed labs done by previous office.  No need to repeat at this time.  Will follow closely.

## 2017-03-17 ENCOUNTER — Encounter: Payer: Self-pay | Admitting: Physician Assistant

## 2017-03-17 ENCOUNTER — Ambulatory Visit (INDEPENDENT_AMBULATORY_CARE_PROVIDER_SITE_OTHER): Payer: BLUE CROSS/BLUE SHIELD | Admitting: Physician Assistant

## 2017-03-17 VITALS — BP 122/76 | HR 80 | Temp 98.4°F | Resp 14 | Ht 67.0 in | Wt 289.0 lb

## 2017-03-17 DIAGNOSIS — J208 Acute bronchitis due to other specified organisms: Secondary | ICD-10-CM

## 2017-03-17 DIAGNOSIS — B9689 Other specified bacterial agents as the cause of diseases classified elsewhere: Secondary | ICD-10-CM | POA: Diagnosis not present

## 2017-03-17 MED ORDER — DOXYCYCLINE HYCLATE 100 MG PO CAPS
100.0000 mg | ORAL_CAPSULE | Freq: Two times a day (BID) | ORAL | 0 refills | Status: DC
Start: 2017-03-17 — End: 2017-03-28

## 2017-03-17 NOTE — Progress Notes (Signed)
Patient presents to clinic today c/o 2 weeks of URI symptoms. Patient endorses sore throat, swollen tonsils, nasal congestion with sinus pressure. Endorses pressure/popping of ears bilatearlly. Endorses cough that is productive of clear sputum. Denies fever, chest pain. Has not taken anything for symptoms.   Past Medical History:  Diagnosis Date  . Arthritis   . Fatty liver   . Gastric ulcer   . GERD (gastroesophageal reflux disease)   . Multiple thyroid nodules 2018  . Palpitations   . Preeclampsia   . Tubular adenoma of colon 02/16/2016    Current Outpatient Prescriptions on File Prior to Visit  Medication Sig Dispense Refill  . Cholecalciferol (VITAMIN D PO) Take by mouth.    . Dexlansoprazole (DEXILANT) 30 MG capsule Take 30 mg by mouth daily.    . Lactobacillus (PROBIOTIC ACIDOPHILUS PO) Take 1 Can by mouth 2 (two) times daily.    Marland Kitchen PAPAYA ENZYMES PO Take 1 capsule by mouth daily.    . sucralfate (CARAFATE) 1 GM/10ML suspension Take 1 g by mouth 4 (four) times daily -  with meals and at bedtime.    . DULoxetine (CYMBALTA) 20 MG capsule Take 1 capsule (20 mg total) by mouth daily. (Patient not taking: Reported on 03/17/2017) 30 capsule 3  . Polyethylene Glycol 3350 GRAN by Does not apply route.     No current facility-administered medications on file prior to visit.     Allergies  Allergen Reactions  . Amoxicillin Anaphylaxis    Make feel tightness of throat and diarrhea and vomiting.   . Azithromycin     Nausea and vomiting.     Family History  Problem Relation Age of Onset  . Cancer Mother     colon  . Diabetes Father   . Multiple sclerosis Sister   . Depression Sister   . Cancer Maternal Aunt     colon  . Cancer Maternal Uncle     lung  . Cancer Maternal Grandmother   . Alcohol abuse Maternal Grandfather   . Cancer Maternal Aunt     colon    Social History   Social History  . Marital status: Married    Spouse name: N/A  . Number of children: N/A  .  Years of education: N/A   Social History Main Topics  . Smoking status: Former Smoker    Packs/day: 0.25    Years: 10.00    Types: Cigarettes    Quit date: 04/08/2009  . Smokeless tobacco: Never Used  . Alcohol use No  . Drug use: No  . Sexual activity: Yes    Birth control/ protection: Surgical   Other Topics Concern  . None   Social History Narrative  . None   Review of Systems - See HPI.  All other ROS are negative.  BP 122/76   Pulse 80   Temp 98.4 F (36.9 C) (Oral)   Resp 14   Ht '5\' 7"'$  (1.702 m)   Wt 289 lb (131.1 kg)   SpO2 98%   BMI 45.26 kg/m   Physical Exam  Recent Results (from the past 2160 hour(s))  Thyroid Peroxidase Antibody     Status: None   Collection Time: 12/24/16 10:47 AM  Result Value Ref Range   Thyroperoxidase Ab SerPl-aCnc <1 <9 IU/mL  T4, free     Status: None   Collection Time: 12/24/16 10:47 AM  Result Value Ref Range   Free T4 1.1 0.8 - 1.8 ng/dL  T3, free  Status: None   Collection Time: 12/24/16 10:47 AM  Result Value Ref Range   T3, Free 2.8 2.3 - 4.2 pg/mL  TSH     Status: None   Collection Time: 12/24/16 10:47 AM  Result Value Ref Range   TSH 1.62 mIU/L    Comment:   Reference Range   > or = 20 Years  0.40-4.50   Pregnancy Range First trimester  0.26-2.66 Second trimester 0.55-2.73 Third trimester  0.43-2.91     POCT Urinalysis Dipstick     Status: Abnormal   Collection Time: 01/03/17 10:06 AM  Result Value Ref Range   Color, UA dark yellow    Clarity, UA clear    Glucose, UA negaitve    Bilirubin, UA moderate    Ketones, UA trace    Spec Grav, UA >=1.030    Blood, UA negative    pH, UA 5.5    Protein, UA 30    Urobilinogen, UA 0.2    Nitrite, UA negative    Leukocytes, UA Negative Negative  CBC with Differential/Platelet     Status: Abnormal   Collection Time: 01/03/17 11:29 AM  Result Value Ref Range   WBC 8.7 3.8 - 10.8 K/uL   RBC 4.97 3.80 - 5.10 MIL/uL   Hemoglobin 12.9 11.7 - 15.5 g/dL   HCT  40.5 35.0 - 45.0 %   MCV 81.5 80.0 - 100.0 fL   MCH 26.0 (L) 27.0 - 33.0 pg   MCHC 31.9 (L) 32.0 - 36.0 g/dL   RDW 15.4 (H) 11.0 - 15.0 %   Platelets 213 140 - 400 K/uL   MPV 10.4 7.5 - 12.5 fL   Neutro Abs 5,655 1,500 - 7,800 cells/uL   Lymphs Abs 2,436 850 - 3,900 cells/uL   Monocytes Absolute 435 200 - 950 cells/uL   Eosinophils Absolute 87 15 - 500 cells/uL   Basophils Absolute 87 0 - 200 cells/uL   Neutrophils Relative % 65 %   Lymphocytes Relative 28 %   Monocytes Relative 5 %   Eosinophils Relative 1 %   Basophils Relative 1 %   Smear Review Criteria for review not met   Comprehensive metabolic panel     Status: None   Collection Time: 01/03/17 11:29 AM  Result Value Ref Range   Sodium 139 135 - 146 mmol/L   Potassium 4.7 3.5 - 5.3 mmol/L   Chloride 102 98 - 110 mmol/L   CO2 23 20 - 31 mmol/L   Glucose, Bld 92 65 - 99 mg/dL   BUN 9 7 - 25 mg/dL   Creat 0.69 0.50 - 1.10 mg/dL   Total Bilirubin 0.5 0.2 - 1.2 mg/dL   Alkaline Phosphatase 70 33 - 115 U/L   AST 16 10 - 30 U/L   ALT 12 6 - 29 U/L   Total Protein 7.5 6.1 - 8.1 g/dL   Albumin 4.2 3.6 - 5.1 g/dL   Calcium 9.6 8.6 - 10.2 mg/dL  Urine culture     Status: None   Collection Time: 01/03/17 11:29 AM  Result Value Ref Range   Organism ID, Bacteria NO GROWTH   Urine Microscopic     Status: None   Collection Time: 01/15/17  9:13 AM  Result Value Ref Range   WBC, UA CANCELED <=5 WBC/HPF    Comment: The preferred specimen for urinalysis testing is the [employer] urine collection tube. Effective 08/31/2016, [employer], a [employer], will reject any urine cup received that  is not transferred into the preferred Stockwell[employer]R) tube. Using this device, specimen components remain stable in transit up to 72 hours at ambient temperatures. Urine may be collected in a clean, unused cup and transferred to the yellow cap transfer tube. Supply order number is: [id number]. If you have any  questions, please contact your [facility] Account Representative directly, or call our Customer Service Department at [phone].  Result canceled by the ancillary    RBC / HPF CANCELED <=2 RBC/HPF    Comment: Result canceled by the ancillary   Squamous Epithelial / LPF CANCELED <=5 HPF    Comment: Result canceled by the ancillary   Bacteria, UA CANCELED NONE SEEN HPF    Comment: Result canceled by the ancillary   Crystals CANCELED NONE SEEN HPF    Comment: Result canceled by the ancillary   Casts CANCELED NONE SEEN LPF    Comment: Result canceled by the ancillary   Yeast NONE SEEN NONE SEEN HPF  Sedimentation rate     Status: None   Collection Time: 01/28/17 11:11 AM  Result Value Ref Range   Sed Rate 20 0 - 20 mm/hr  Cyclic citrul peptide antibody, IgG     Status: None   Collection Time: 01/28/17 11:11 AM  Result Value Ref Range   Cyclic Citrullin Peptide Ab <16 Units    Comment:   Reference Range Negative               < 20 Weak Positive            20 - 39 Moderate Positive        40 - 59 Strong Positive        > 59   CK     Status: None   Collection Time: 01/28/17 11:11 AM  Result Value Ref Range   Total CK 56 7 - 177 U/L  ANA     Status: None   Collection Time: 01/28/17 11:11 AM  Result Value Ref Range   Anit Nuclear Antibody(ANA) NEG NEGATIVE  Rheumatoid factor     Status: None   Collection Time: 01/28/17 11:11 AM  Result Value Ref Range   Rhuematoid fact SerPl-aCnc <14 <14 IU/mL  C-reactive protein     Status: None   Collection Time: 01/28/17 11:11 AM  Result Value Ref Range   CRP 3.9 <8.0 mg/L   Assessment/Plan: 1. Acute bacterial bronchitis Rx Doxycycline. Supportive measures and OTC medications reviewed.  Follow-up if not resolving. - doxycycline (VIBRAMYCIN) 100 MG capsule; Take 1 capsule (100 mg total) by mouth 2 (two) times daily.  Dispense: 20 capsule; Refill: 0   Leeanne Rio, [place]

## 2017-03-17 NOTE — Patient Instructions (Signed)
Take antibiotic (Doxycycline) as directed.  Increase fluids.  Get plenty of rest. Use Mucinex for congestion. Take a daily probiotic (I recommend Align or Culturelle, but even Activia Yogurt may be beneficial).  A humidifier placed in the bedroom may offer some relief for a dry, scratchy throat of nasal irritation.  Read information below on acute bronchitis. Please call or return to clinic if symptoms are not improving.  Acute Bronchitis Bronchitis is when the airways that extend from the windpipe into the lungs get red, puffy, and painful (inflamed). Bronchitis often causes thick spit (mucus) to develop. This leads to a cough. A cough is the most common symptom of bronchitis. In acute bronchitis, the condition usually begins suddenly and goes away over time (usually in 2 weeks). Smoking, allergies, and asthma can make bronchitis worse. Repeated episodes of bronchitis may cause more lung problems.  HOME CARE  Rest.  Drink enough fluids to keep your pee (urine) clear or pale yellow (unless you need to limit fluids as told by your doctor).  Only take over-the-counter or prescription medicines as told by your doctor.  Avoid smoking and secondhand smoke. These can make bronchitis worse. If you are a smoker, think about using nicotine gum or skin patches. Quitting smoking will help your lungs heal faster.  Reduce the chance of getting bronchitis again by:  Washing your hands often.  Avoiding people with cold symptoms.  Trying not to touch your hands to your mouth, nose, or eyes.  Follow up with your doctor as told.  GET HELP IF: Your symptoms do not improve after 1 week of treatment. Symptoms include:  Cough.  Fever.  Coughing up thick spit.  Body aches.  Chest congestion.  Chills.  Shortness of breath.  Sore throat.  GET HELP RIGHT AWAY IF:   You have an increased fever.  You have chills.  You have severe shortness of breath.  You have bloody thick spit  (sputum).  You throw up (vomit) often.  You lose too much body fluid (dehydration).  You have a severe headache.  You faint.  MAKE SURE YOU:   Understand these instructions.  Will watch your condition.  Will get help right away if you are not doing well or get worse. Document Released: 05/13/2008 Document Revised: 07/28/2013 Document Reviewed: 05/18/2013 [facility] Patient Information 2015 [place], [place]. This information is not intended to replace advice given to you by your health care provider. Make sure you discuss any questions you have with your health care provider.

## 2017-03-17 NOTE — Progress Notes (Signed)
Pre visit review using our clinic review tool, if applicable. No additional management support is needed unless otherwise documented below in the visit note. 

## 2017-03-21 ENCOUNTER — Telehealth: Payer: Self-pay | Admitting: Family Medicine

## 2017-03-21 MED ORDER — NYSTATIN 100000 UNIT/ML MT SUSP: 5.0000 mL | Freq: Four times a day (QID) | OROMUCOSAL | 0 refills | Status: DC

## 2017-03-21 NOTE — Telephone Encounter (Signed)
[place] Patient Name: Kayla Meza DOB: 01-14-1981 Initial Comment Caller states she was prescribed an antibiotic for an upper respiratory infection and thinks she has thrush. She states she is experiencing tingling and whiteness on the tongue. Nurse Assessment Nurse: Martyn Ehrich RN, Felicia Date/Time (Eastern Time): 03/21/2017 4:22:28 PM Confirm and document reason for call. If symptomatic, describe symptoms. ---She is still on antibiotics for sinuses and that has improved. No fever. But mouth feels different and she had thrush in January and she was treated with fluconazole and nystatin. This feels the same way. There is a white coating on tongue. No fever. Does the patient have any new or worsening symptoms? ---Yes Will a triage be completed? ---Yes Related visit to physician within the last 2 weeks? ---Yes Does the PT have any chronic conditions? (i.e. diabetes, asthma, etc.) ---YesList chronic conditions. ---allergies Is the patient pregnant or possibly pregnant? (Ask all females between the ages of 40-55) ---No Is this a behavioral health or substance abuse call? ---No Guidelines Guideline Title Affirmed Question Affirmed Notes Tongue Swelling All other adults with swollen tongue (Exception: tongue swelling is a recurrent problem AND NO swelling at present) Final Disposition User Go to ED Now Martyn Ehrich, RN, [place] Comments tingling in her entired tongue - tingling and burning speak sounds like tongue is thick PHarmacy no. is [facility] [address] in [place] [phone] she didnt feel the white on the tongue was thick enough to attributeto the swelling in tongue she is feeling - she has been scraping it and brushing it and rinsing in salt water Told caller that if she doesnt go to ER UC is only other option this weekend bc we are closed on Sat. (Though reinforced ER  is advised). Spoke with Levada Dy in office and she said that Dr. Birdie Riddle is finished for the day. Nothing can be done at this office today.Referrals GO TO FACILITY REFUSED Disagree/Comply: Disagree Disagree/Comply Reason: Disagree with instructions Call Id: [id number]

## 2017-03-21 NOTE — Telephone Encounter (Signed)
Will send in Rx Nystatin suspension. If any worsening symptoms or any swelling of tongue -- UC or ER this weekend.

## 2017-03-21 NOTE — Telephone Encounter (Signed)
Patient states she has white coating on her tongue, some white specs on top of roof of mouth.  Some sore throat, no swelling Had episode of thrush in Jan. Unsure if was completed resolved. The tongue does improve with the salt water rinse. Sinuses has improved, discharge is clearing up, still coughing.

## 2017-03-26 ENCOUNTER — Ambulatory Visit (INDEPENDENT_AMBULATORY_CARE_PROVIDER_SITE_OTHER): Payer: BLUE CROSS/BLUE SHIELD | Admitting: Physician Assistant

## 2017-03-26 ENCOUNTER — Encounter: Payer: Self-pay | Admitting: Physician Assistant

## 2017-03-26 VITALS — BP 120/80 | HR 82 | Temp 97.9°F | Resp 14 | Ht 67.0 in | Wt 290.0 lb

## 2017-03-26 DIAGNOSIS — J209 Acute bronchitis, unspecified: Secondary | ICD-10-CM

## 2017-03-26 DIAGNOSIS — B370 Candidal stomatitis: Secondary | ICD-10-CM

## 2017-03-26 NOTE — Progress Notes (Signed)
Pre visit review using our clinic review tool, if applicable. No additional management support is needed unless otherwise documented below in the visit note. 

## 2017-03-26 NOTE — Progress Notes (Signed)
Patient presents to clinic today for follow-up of URI and oral thrush. Patient was seen on 03/17/17 and diagnosed with bronchitis. Was started on Doxycycline. Is taking medication as directed. Has noted resolution of sinus symptoms. Is still having a cough that is productive of  thick mucous but is clear now. Denies fever, chest pain or SOB. Denies new symptoms. Patient endorses history of these infections, occurring 4-5 times per year. States she is concerned about a deficiency that is causing her to be prone to infection. Has taken Nystatin as directed with resolution of oral thrush.   Past Medical History:  Diagnosis Date  . Arthritis   . Fatty liver   . Gastric ulcer   . GERD (gastroesophageal reflux disease)   . Multiple thyroid nodules 2018  . Palpitations   . Preeclampsia   . Tubular adenoma of colon 02/16/2016    Current Outpatient Prescriptions on File Prior to Visit  Medication Sig Dispense Refill  . Cholecalciferol (VITAMIN D PO) Take by mouth.    . Dexlansoprazole (DEXILANT) 30 MG capsule Take 30 mg by mouth daily.    Marland Kitchen doxycycline (VIBRAMYCIN) 100 MG capsule Take 1 capsule (100 mg total) by mouth 2 (two) times daily. 20 capsule 0  . DULoxetine (CYMBALTA) 20 MG capsule Take 1 capsule (20 mg total) by mouth daily. 30 capsule 3  . Lactobacillus (PROBIOTIC ACIDOPHILUS PO) Take 1 Can by mouth 2 (two) times daily.    Marland Kitchen nystatin (MYCOSTATIN) 100000 UNIT/ML suspension Take 5 mLs (500,000 Units total) by mouth 4 (four) times daily. Swish and spit. 60 mL 0  . PAPAYA ENZYMES PO Take 1 capsule by mouth daily.    . Polyethylene Glycol 3350 GRAN by Does not apply route.    . sucralfate (CARAFATE) 1 GM/10ML suspension Take 1 g by mouth 4 (four) times daily -  with meals and at bedtime.     No current facility-administered medications on file prior to visit.     Allergies  Allergen Reactions  . Amoxicillin Anaphylaxis    Make feel tightness of throat and diarrhea and vomiting.   .  Azithromycin     Nausea and vomiting.     Family History  Problem Relation Age of Onset  . Cancer Mother     colon  . Diabetes Father   . Multiple sclerosis Sister   . Depression Sister   . Cancer Maternal Aunt     colon  . Cancer Maternal Uncle     lung  . Cancer Maternal Grandmother   . Alcohol abuse Maternal Grandfather   . Cancer Maternal Aunt     colon    Social History   Social History  . Marital status: Married    Spouse name: N/A  . Number of children: N/A  . Years of education: N/A   Social History Main Topics  . Smoking status: Former Smoker    Packs/day: 0.25    Years: 10.00    Types: Cigarettes    Quit date: 04/08/2009  . Smokeless tobacco: Never Used  . Alcohol use No  . Drug use: No  . Sexual activity: Yes    Birth control/ protection: Surgical   Other Topics Concern  . None   Social History Narrative  . None   Review of Systems - See HPI.  All other ROS are negative.  BP 120/80   Pulse 82   Temp 97.9 F (36.6 C) (Oral)   Resp 14   Ht '5\' 7"'$  (1.702  m)   Wt 290 lb (131.5 kg)   SpO2 99%   BMI 45.42 kg/m   Physical Exam  Constitutional: She is oriented to person, place, and time and well-developed, well-nourished, and in no distress.  HENT:  Head: Normocephalic and atraumatic.  Right Ear: Tympanic membrane normal.  Left Ear: Tympanic membrane normal.  Nose: Nose normal. No mucosal edema or rhinorrhea.  Mouth/Throat: Uvula is midline, oropharynx is clear and moist and mucous membranes are normal.  Eyes: Conjunctivae are normal. Pupils are equal, round, and reactive to light.  Neck: Neck supple.  Cardiovascular: Normal rate, regular rhythm, normal heart sounds and intact distal pulses.   Pulmonary/Chest: Effort normal and breath sounds normal. No respiratory distress. She has no wheezes. She has no rales. She exhibits no tenderness.  Lymphadenopathy:    She has no cervical adenopathy.  Neurological: She is alert and oriented to person,  place, and time.  Skin: Skin is warm and dry. No rash noted.  Psychiatric: Affect normal.  Vitals reviewed.  Recent Results (from the past 2160 hour(s))  POCT Urinalysis Dipstick     Status: Abnormal   Collection Time: 01/03/17 10:06 AM  Result Value Ref Range   Color, UA dark yellow    Clarity, UA clear    Glucose, UA negaitve    Bilirubin, UA moderate    Ketones, UA trace    Spec Grav, UA >=1.030    Blood, UA negative    pH, UA 5.5    Protein, UA 30    Urobilinogen, UA 0.2    Nitrite, UA negative    Leukocytes, UA Negative Negative  CBC with Differential/Platelet     Status: Abnormal   Collection Time: 01/03/17 11:29 AM  Result Value Ref Range   WBC 8.7 3.8 - 10.8 K/uL   RBC 4.97 3.80 - 5.10 MIL/uL   Hemoglobin 12.9 11.7 - 15.5 g/dL   HCT 40.5 35.0 - 45.0 %   MCV 81.5 80.0 - 100.0 fL   MCH 26.0 (L) 27.0 - 33.0 pg   MCHC 31.9 (L) 32.0 - 36.0 g/dL   RDW 15.4 (H) 11.0 - 15.0 %   Platelets 213 140 - 400 K/uL   MPV 10.4 7.5 - 12.5 fL   Neutro Abs 5,655 1,500 - 7,800 cells/uL   Lymphs Abs 2,436 850 - 3,900 cells/uL   Monocytes Absolute 435 200 - 950 cells/uL   Eosinophils Absolute 87 15 - 500 cells/uL   Basophils Absolute 87 0 - 200 cells/uL   Neutrophils Relative % 65 %   Lymphocytes Relative 28 %   Monocytes Relative 5 %   Eosinophils Relative 1 %   Basophils Relative 1 %   Smear Review Criteria for review not met   Comprehensive metabolic panel     Status: None   Collection Time: 01/03/17 11:29 AM  Result Value Ref Range   Sodium 139 135 - 146 mmol/L   Potassium 4.7 3.5 - 5.3 mmol/L   Chloride 102 98 - 110 mmol/L   CO2 23 20 - 31 mmol/L   Glucose, Bld 92 65 - 99 mg/dL   BUN 9 7 - 25 mg/dL   Creat 0.69 0.50 - 1.10 mg/dL   Total Bilirubin 0.5 0.2 - 1.2 mg/dL   Alkaline Phosphatase 70 33 - 115 U/L   AST 16 10 - 30 U/L   ALT 12 6 - 29 U/L   Total Protein 7.5 6.1 - 8.1 g/dL   Albumin 4.2 3.6 - 5.1 g/dL  Calcium 9.6 8.6 - 10.2 mg/dL  Urine culture     Status:  None   Collection Time: 01/03/17 11:29 AM  Result Value Ref Range   Organism ID, Bacteria NO GROWTH   Urine Microscopic     Status: None   Collection Time: 01/15/17  9:13 AM  Result Value Ref Range   WBC, UA CANCELED <=5 WBC/HPF    Comment: The preferred specimen for urinalysis testing is the [employer] urine collection tube. Effective 08/31/2016, [employer], a [employer], will reject any urine cup received that is not transferred into the preferred Stockwell[employer]R) tube. Using this device, specimen components remain stable in transit up to 72 hours at ambient temperatures. Urine may be collected in a clean, unused cup and transferred to the yellow cap transfer tube. Supply order number is: [id number]. If you have any questions, please contact your [facility] Account Representative directly, or call our Customer Service Department at [phone].  Result canceled by the ancillary    RBC / HPF CANCELED <=2 RBC/HPF    Comment: Result canceled by the ancillary   Squamous Epithelial / LPF CANCELED <=5 HPF    Comment: Result canceled by the ancillary   Bacteria, UA CANCELED NONE SEEN HPF    Comment: Result canceled by the ancillary   Crystals CANCELED NONE SEEN HPF    Comment: Result canceled by the ancillary   Casts CANCELED NONE SEEN LPF    Comment: Result canceled by the ancillary   Yeast NONE SEEN NONE SEEN HPF  Sedimentation rate     Status: None   Collection Time: 01/28/17 11:11 AM  Result Value Ref Range   Sed Rate 20 0 - 20 mm/hr  Cyclic citrul peptide antibody, IgG     Status: None   Collection Time: 01/28/17 11:11 AM  Result Value Ref Range   Cyclic Citrullin Peptide Ab <16 Units    Comment:   Reference Range Negative               < 20 Weak Positive            20 - 39 Moderate Positive        40 - 59 Strong Positive        > 59   CK     Status: None   Collection Time: 01/28/17 11:11 AM  Result Value Ref Range   Total CK 56 7  - 177 U/L  ANA     Status: None   Collection Time: 01/28/17 11:11 AM  Result Value Ref Range   Anit Nuclear Antibody(ANA) NEG NEGATIVE  Rheumatoid factor     Status: None   Collection Time: 01/28/17 11:11 AM  Result Value Ref Range   Rhuematoid fact SerPl-aCnc <14 <14 IU/mL  C-reactive protein     Status: None   Collection Time: 01/28/17 11:11 AM  Result Value Ref Range   CRP 3.9 <8.0 mg/L   Assessment/Plan: 1. Acute bronchitis, unspecified organism Improving. Some thick clear mucous in the mornings. Is not taking a mucolytic. Denies any new or worsened symptoms. Lungs CTAB. Exam unremarkable. Finish antibiotics, start mucolytic -- Mucinex-DM recommended. Continue supportive measures. Will speak with PCP concerning potential benefit of Immunology.   2. Oral thrush Clinically resolved. Continue supportive measures.   Leeanne Rio, PA-C

## 2017-03-26 NOTE — Patient Instructions (Addendum)
Please finish the Doxycycline as directed. Start an over-the-counter mucinex-DM. This will thin mucous and help with cough. I am glad symptoms are improving. Please try to eat a healthy diet. I do not see any residual thrush on examination. Continue good oral hygiene.   I will speak with your PCP regarding potential need for Immunology assessment.   If symptoms are not continuing to improve/resolve, please let me know.   Start a Zantac 75 mg each morning over the next few days to help with allergies in addition to your nightly Zyrtec.

## 2017-03-28 ENCOUNTER — Telehealth: Payer: Self-pay | Admitting: Family Medicine

## 2017-03-28 MED ORDER — SULFAMETHOXAZOLE-TRIMETHOPRIM 800-160 MG PO TABS: 1.0000 | ORAL_TABLET | Freq: Two times a day (BID) | ORAL | 0 refills | Status: DC

## 2017-03-28 NOTE — Telephone Encounter (Signed)
Patient Name: Kayla Meza  DOB: Jul 10, 1981    Initial Comment Caller states last night she had trouble swallowing, heart rate went up between 120-130   Nurse Assessment  Nurse: Mallie Mussel, RN, Alveta Heimlich Date/Time (Eastern Time): 03/28/2017 8:24:00 AM  Confirm and document reason for call. If symptomatic, describe symptoms. ---Caller states that last night she felt like her throat was swollen. She has had problems with her sinuses and bronchitis for the past week. For a couple minutes, her heart rate increased into the 120-130. She states that after this, she began urinating very frequently and "lost 7 lbs." She denies difficulty breathing. She has some soreness in her throat. She rates the pain as 2-3 on 0-10 scale. Denies fever. She states that she had swelling in her legs, but that is normal for her. They are not as swollen as much now. She denies chest pain. She is no longer urinating frequently. She denies burning/pain with urination. She denies any symptoms at this time that concern her. She states that the only symptoms she has right now are the scratchy throat and cramps when she gets her period which began last night. She does not feel that she needs to see the doctor. She just wanted to see if there is anything that she needs to look out for based on information from a friend who is a nurse at the hospital. Advised her that I will forward this information to the office and request that someone call her back (since I am not familiar with her complete history). She verbalized understanding.  Does the patient have any new or worsening symptoms? ---No     Guidelines    Guideline Title Affirmed Question Affirmed Notes       Final Disposition User

## 2017-03-28 NOTE — Telephone Encounter (Signed)
Pt states that she has taken all of abx and is not feeling any better. Pt asking if something else could be called in to [facility] on [facility]

## 2017-03-28 NOTE — Telephone Encounter (Signed)
LMOVM for call back for recommendations

## 2017-03-28 NOTE — Telephone Encounter (Signed)
Rx sent to pharmacy. Take as directed. Only contraindication would be if she has any concern about being pregnant currently.

## 2017-03-28 NOTE — Telephone Encounter (Signed)
Patient states she had a tubal ligation, no change of being pregnant

## 2017-03-28 NOTE — Telephone Encounter (Signed)
See other phone note

## 2017-03-28 NOTE — Telephone Encounter (Signed)
Spoke with patient and she states she is not having the tachycardia. She has tolerated Bactrim in the past. She is agreeable with starting the abx for her symptoms.

## 2017-03-28 NOTE — Telephone Encounter (Signed)
Can you please talk to her

## 2017-03-28 NOTE — Telephone Encounter (Signed)
Spoke with patient regarding symptoms. Patient reports she has felt "off" for the last week with increased emotions, crying and stress. Reports an episode yesterday of increased heart rate (120-130's) for < 2 minutes and difficulty swallowing her pills. Denies chest pain or shortness of breath. Advised that emotional change could be d/t hormones (started menstrual cycle yesterday). Encouraged to f/u with cardiology with recurrent tachycardia episodes. Advised to go to Emergency Department with prolonged tachycardia, SOB, or CP. Patient verbalized understanding.   Patient also states she completed her antibiotic treatment for bronchitis but continues to have throat irritation, pressure in her ears, fatigue and clear, thick sinus drainage. Denies fever. Would like to a different antibiotic, stating the last time she took doxycycline it did not work for her.

## 2017-03-28 NOTE — Telephone Encounter (Signed)
Please reiterate Kim's instructions to patient.  I am ok to send in an Rx for Bactrim if she is willing. Verify if she has taken before that she has tolerated.

## 2017-03-31 ENCOUNTER — Encounter: Payer: Self-pay | Admitting: Physician Assistant

## 2017-03-31 ENCOUNTER — Ambulatory Visit: Payer: BLUE CROSS/BLUE SHIELD | Admitting: Family Medicine

## 2017-03-31 ENCOUNTER — Ambulatory Visit (INDEPENDENT_AMBULATORY_CARE_PROVIDER_SITE_OTHER): Payer: BLUE CROSS/BLUE SHIELD | Admitting: Physician Assistant

## 2017-03-31 VITALS — BP 122/74 | HR 68 | Temp 98.1°F | Resp 16 | Ht 67.0 in | Wt 289.0 lb

## 2017-03-31 DIAGNOSIS — K2900 Acute gastritis without bleeding: Secondary | ICD-10-CM | POA: Diagnosis not present

## 2017-03-31 DIAGNOSIS — J209 Acute bronchitis, unspecified: Secondary | ICD-10-CM | POA: Diagnosis not present

## 2017-03-31 DIAGNOSIS — K209 Esophagitis, unspecified without bleeding: Secondary | ICD-10-CM | POA: Insufficient documentation

## 2017-03-31 NOTE — Progress Notes (Signed)
Patient presents to clinic today for ER follow-up. Patient presented to  ER on 03/30/17 with c/o dysphagia and peripheral edema. ER workup included unremarkable labs, negative strep testing and negative CXR. Patient was diagnosed with pharyngitis. No evidence of swelling of mouth or extremities on exam. Was instructed to continue antibiotics and to follow-up with PCP.  Since discharge, patient endorses feeling much better. Denies any throat swelling or dysphagia. Is taking Bactrim as directed for bronchitis with improvement in symptoms. Denies any swelling of extremities. Does note heart burn and LUQ tenderness worse with meals. Denies melena, hematochezia or tenesmus.  Past Medical History:  Diagnosis Date  . Arthritis   . Fatty liver   . Gastric ulcer   . GERD (gastroesophageal reflux disease)   . Multiple thyroid nodules 2018  . Palpitations   . Preeclampsia   . Tubular adenoma of colon 02/16/2016    Current Outpatient Prescriptions on File Prior to Visit  Medication Sig Dispense Refill  . Cholecalciferol (VITAMIN D PO) Take 5,000 capsules by mouth daily.     Marland Kitchen Dexlansoprazole (DEXILANT) 30 MG capsule Take 30 mg by mouth daily.    . Lactobacillus (PROBIOTIC ACIDOPHILUS PO) Take 1 Can by mouth 2 (two) times daily.    Marland Kitchen PAPAYA ENZYMES PO Take 1 capsule by mouth daily.    . sucralfate (CARAFATE) 1 GM/10ML suspension Take 1 g by mouth 4 (four) times daily -  with meals and at bedtime.    . sulfamethoxazole-trimethoprim (BACTRIM DS,SEPTRA DS) 800-160 MG tablet Take 1 tablet by mouth 2 (two) times daily. 14 tablet 0  . DULoxetine (CYMBALTA) 20 MG capsule Take 1 capsule (20 mg total) by mouth daily. (Patient not taking: Reported on 03/31/2017) 30 capsule 3  . nystatin (MYCOSTATIN) 100000 UNIT/ML suspension Take 5 mLs (500,000 Units total) by mouth 4 (four) times daily. Swish and spit. (Patient not taking: Reported on 03/31/2017) 60 mL 0  . Polyethylene Glycol 3350 GRAN by Does not apply route.      No current facility-administered medications on file prior to visit.     Allergies  Allergen Reactions  . Amoxicillin Anaphylaxis    Make feel tightness of throat and diarrhea and vomiting.   . Prednisone Other (See Comments)    Fast hear rate  . Azithromycin     Nausea and vomiting.     Family History  Problem Relation Age of Onset  . Cancer Mother     colon  . Diabetes Father   . Multiple sclerosis Sister   . Depression Sister   . Cancer Maternal Aunt     colon  . Cancer Maternal Uncle     lung  . Cancer Maternal Grandmother   . Alcohol abuse Maternal Grandfather   . Cancer Maternal Aunt     colon    Social History   Social History  . Marital status: Married    Spouse name: N/A  . Number of children: N/A  . Years of education: N/A   Social History Main Topics  . Smoking status: Former Smoker    Packs/day: 0.25    Years: 10.00    Types: Cigarettes    Quit date: 04/08/2009  . Smokeless tobacco: Never Used  . Alcohol use No  . Drug use: No  . Sexual activity: Yes    Birth control/ protection: Surgical   Other Topics Concern  . None   Social History Narrative  . None   Review of Systems - See HPI.  All other ROS are negative.  BP 122/74   Pulse 68   Temp 98.1 F (36.7 C) (Oral)   Resp 16   Ht _0  (1.702 m)   Wt 289 lb (131.1 kg)   SpO2 100%   BMI 45.26 kg/m   Physical Exam  Constitutional: She is oriented to person, place, and time and well-developed, well-nourished, and in no distress.  HENT:  Head: Normocephalic and atraumatic.  Right Ear: External ear normal.  Left Ear: External ear normal.  Nose: Nose normal.  Mouth/Throat: Oropharynx is clear and moist. No oropharyngeal exudate.  TM within normal limits bilaterally.  Eyes: Conjunctivae are normal.  Neck: Neck supple.  Cardiovascular: Normal rate, regular rhythm, normal heart sounds and intact distal pulses.   Pulmonary/Chest: Effort normal and breath sounds normal. No  respiratory distress. She has no wheezes. She has no rales. She exhibits no tenderness.  Abdominal: Soft. Bowel sounds are normal. She exhibits no distension.  Mild LUQ tenderness on examination. No palpable mass or organomegaly.  Lymphadenopathy:    She has no cervical adenopathy.  Neurological: She is alert and oriented to person, place, and time.  Skin: Skin is warm and dry. No rash noted.  Psychiatric: Affect normal.  Vitals reviewed.  Recent Results (from the past 2160 hour(s))  POCT Urinalysis Dipstick     Status: Abnormal   Collection Time: 01/03/17 10:06 AM  Result Value Ref Range   Color, UA dark yellow    Clarity, UA clear    Glucose, UA negaitve    Bilirubin, UA moderate    Ketones, UA trace    Spec Grav, UA >=1.030    Blood, UA negative    pH, UA 5.5    Protein, UA 30    Urobilinogen, UA 0.2    Nitrite, UA negative    Leukocytes, UA Negative Negative  CBC with Differential/Platelet     Status: Abnormal   Collection Time: 01/03/17 11:29 AM  Result Value Ref Range   WBC 8.7 3.8 - 10.8 K/uL   RBC 4.97 3.80 - 5.10 MIL/uL   Hemoglobin 12.9 11.7 - 15.5 g/dL   HCT 40.5 35.0 - 45.0 %   MCV 81.5 80.0 - 100.0 fL   MCH 26.0 (L) 27.0 - 33.0 pg   MCHC 31.9 (L) 32.0 - 36.0 g/dL   RDW 15.4 (H) 11.0 - 15.0 %   Platelets 213 140 - 400 K/uL   MPV 10.4 7.5 - 12.5 fL   Neutro Abs 5,655 1,500 - 7,800 cells/uL   Lymphs Abs 2,436 850 - 3,900 cells/uL   Monocytes Absolute 435 200 - 950 cells/uL   Eosinophils Absolute 87 15 - 500 cells/uL   Basophils Absolute 87 0 - 200 cells/uL   Neutrophils Relative % 65 %   Lymphocytes Relative 28 %   Monocytes Relative 5 %   Eosinophils Relative 1 %   Basophils Relative 1 %   Smear Review Criteria for review not met   Comprehensive metabolic panel     Status: None   Collection Time: 01/03/17 11:29 AM  Result Value Ref Range   Sodium 139 135 - 146 mmol/L   Potassium 4.7 3.5 - 5.3 mmol/L   Chloride 102 98 - 110 mmol/L   CO2 23 20 - 31  mmol/L   Glucose, Bld 92 65 - 99 mg/dL   BUN 9 7 - 25 mg/dL   Creat 0.69 0.50 - 1.10 mg/dL   Total Bilirubin 0.5 0.2 - 1.2 mg/dL  Alkaline Phosphatase 70 33 - 115 U/L   AST 16 10 - 30 U/L   ALT 12 6 - 29 U/L   Total Protein 7.5 6.1 - 8.1 g/dL   Albumin 4.2 3.6 - 5.1 g/dL   Calcium 9.6 8.6 - 10.2 mg/dL  Urine culture     Status: None   Collection Time: 01/03/17 11:29 AM  Result Value Ref Range   Organism ID, Bacteria NO GROWTH   Urine Microscopic     Status: None   Collection Time: 01/15/17  9:13 AM  Result Value Ref Range   WBC, UA CANCELED <=5 WBC/HPF    Comment: The preferred specimen for urinalysis testing is the [employer] urine collection tube. Effective 08/31/2016, [employer], a [employer], will reject any urine cup received that is not transferred into the preferred [employer]R) tube. Using this device, specimen components remain stable in transit up to 72 hours at ambient temperatures. Urine may be collected in a clean, unused cup and transferred to the yellow cap transfer tube. Supply order number is: [id number]. If you have any questions, please contact your [facility] Account Representative directly, or call our Customer Service Department at [phone].  Result canceled by the ancillary    RBC / HPF CANCELED <=2 RBC/HPF    Comment: Result canceled by the ancillary   Squamous Epithelial / LPF CANCELED <=5 HPF    Comment: Result canceled by the ancillary   Bacteria, UA CANCELED NONE SEEN HPF    Comment: Result canceled by the ancillary   Crystals CANCELED NONE SEEN HPF    Comment: Result canceled by the ancillary   Casts CANCELED NONE SEEN LPF    Comment: Result canceled by the ancillary   Yeast NONE SEEN NONE SEEN HPF  Sedimentation rate     Status: None   Collection Time: 01/28/17 11:11 AM  Result Value Ref Range   Sed Rate 20 0 - 20 mm/hr  Cyclic citrul peptide antibody, IgG     Status: None   Collection Time:  01/28/17 11:11 AM  Result Value Ref Range   Cyclic Citrullin Peptide Ab <16 Units    Comment:   Reference Range Negative               < 20 Weak Positive            20 - 39 Moderate Positive        40 - 59 Strong Positive        > 59   CK     Status: None   Collection Time: 01/28/17 11:11 AM  Result Value Ref Range   Total CK 56 7 - 177 U/L  ANA     Status: None   Collection Time: 01/28/17 11:11 AM  Result Value Ref Range   Anit Nuclear Antibody(ANA) NEG NEGATIVE  Rheumatoid factor     Status: None   Collection Time: 01/28/17 11:11 AM  Result Value Ref Range   Rhuematoid fact SerPl-aCnc <14 <14 IU/mL  C-reactive protein     Status: None   Collection Time: 01/28/17 11:11 AM  Result Value Ref Range   CRP 3.9 <8.0 mg/L    Assessment/Plan: 1. Acute gastritis without hemorrhage, unspecified gastritis type Restart Carafate. Take Dexilant as directed. Supportive measures reviewed. Follow-up if symptoms are not improving, resolving in a timely fashion.  2. Acute bronchitis, unspecified organism Resolving. Complete course of Bactrim. Supportive measures and OTC medications again reviewed with patient.    Hassell Done,  Sandria Manly, PA-C

## 2017-03-31 NOTE — Patient Instructions (Signed)
Please make sure to take the Egan as directed. Start a Zantac 150 mg at night. Can use Carafate for a couple of days to help with gastritis. Please schedule follow-up with your Gastroenterology.  Please limit salt intake.  Can try compression stockings if you note any increase swelling. I do not see any sign of edema on examination today.  Finish entire course of antibiotic as it seems to be resolving respiratory symptoms.  You will be contacted for assessment with Neurology and Opthalmology for further assessment of chronic issues. Marland Kitchen

## 2017-03-31 NOTE — Progress Notes (Signed)
Pre visit review using our clinic review tool, if applicable. No additional management support is needed unless otherwise documented below in the visit note. 

## 2017-04-14 ENCOUNTER — Ambulatory Visit (INDEPENDENT_AMBULATORY_CARE_PROVIDER_SITE_OTHER): Payer: BLUE CROSS/BLUE SHIELD | Admitting: Family Medicine

## 2017-04-14 ENCOUNTER — Encounter: Payer: Self-pay | Admitting: Family Medicine

## 2017-04-14 VITALS — BP 122/82 | HR 90 | Resp 16 | Ht 67.0 in | Wt 281.2 lb

## 2017-04-14 DIAGNOSIS — I8390 Asymptomatic varicose veins of unspecified lower extremity: Secondary | ICD-10-CM

## 2017-04-14 DIAGNOSIS — M797 Fibromyalgia: Secondary | ICD-10-CM

## 2017-04-14 DIAGNOSIS — I83893 Varicose veins of bilateral lower extremities with other complications: Secondary | ICD-10-CM | POA: Insufficient documentation

## 2017-04-14 NOTE — Progress Notes (Signed)
   Subjective:    Patient ID: Kayla Meza, female    DOB: [birth year], 36 y.o.   MRN: [id number]  HPI Pain/mood- pt was started on Cymbalta 3/19 but she did not take the medication.  She did not start b/c she is 'afraid of the side effects'.  Pt reports feeling 'a ton better' since having surgery to remove her bottom teeth'.  Pt reports pain improved w/ Bactrim- no longer joint pains but she was taking Hydrocodone and Ibuprofen from dentist.  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    Varicose veins- pt has ongoing leg swelling.  Asking for referral to Vascular  Review of Systems For ROS see HPI     Objective:   Physical Exam  Constitutional: She is oriented to person, place, and time. She appears well-developed and well-nourished. No distress.  Morbidly obese  HENT:  Head: Normocephalic and atraumatic.  Eyes: Conjunctivae and EOM are normal. Pupils are equal, round, and reactive to light.  Neck: Normal range of motion. Neck supple. No thyromegaly present.  Cardiovascular: Normal rate, regular rhythm, normal heart sounds and intact distal pulses.   No murmur heard. Pulmonary/Chest: Effort normal and breath sounds normal. No respiratory distress.  Abdominal: Soft. She exhibits no distension. There is no tenderness.  Musculoskeletal: She exhibits no edema (no swelling on exam) or tenderness.  Lymphadenopathy:    She has no cervical adenopathy.  Neurological: She is alert and oriented to person, place, and time.  Skin: Skin is warm and dry.  Psychiatric: She has a normal mood and affect. Her behavior is normal.  Vitals reviewed.         Assessment & Plan:

## 2017-04-14 NOTE — Assessment & Plan Note (Signed)
Chronic problem.  Pt's pain has been much better since she had her teeth removed.  She has been dx'd w/ reactive arthritis and if she had a chronic source of infxn, this could have been causing her sxs.  She did not start Cymbalta for fear of side effects.  No need at this time but will follow.

## 2017-04-14 NOTE — Progress Notes (Signed)
Pre visit review using our clinic review tool, if applicable. No additional management support is needed unless otherwise documented below in the visit note. 

## 2017-04-14 NOTE — Patient Instructions (Signed)
Schedule your complete physical in 6 months We'll call you with your Vascular appointment for the Varicose Veins and leg swelling Continue to work on healthy diet and regular exercise- you can do it! Call with any questions or concerns Happy Early Birthday!!!

## 2017-04-14 NOTE — Assessment & Plan Note (Signed)
New.  Pt is concerned that this may be the cause of her swelling.  No swelling noted on exam today despite pt feeling that they remain swollen.  Difficult to explain that her legs are large due to her body habitus/size.  Will refer to vascular for evaluation/management

## 2017-04-15 ENCOUNTER — Other Ambulatory Visit: Payer: Self-pay | Admitting: Vascular Surgery

## 2017-04-15 DIAGNOSIS — M7989 Other specified soft tissue disorders: Secondary | ICD-10-CM

## 2017-04-28 ENCOUNTER — Telehealth: Payer: Self-pay | Admitting: *Deleted

## 2017-04-28 ENCOUNTER — Encounter: Payer: Self-pay | Admitting: Family Medicine

## 2017-04-28 ENCOUNTER — Ambulatory Visit (INDEPENDENT_AMBULATORY_CARE_PROVIDER_SITE_OTHER): Payer: BLUE CROSS/BLUE SHIELD | Admitting: Family Medicine

## 2017-04-28 VITALS — BP 126/83 | HR 80 | Temp 98.1°F | Resp 16 | Ht 67.0 in | Wt 288.1 lb

## 2017-04-28 DIAGNOSIS — N946 Dysmenorrhea, unspecified: Secondary | ICD-10-CM | POA: Diagnosis not present

## 2017-04-28 DIAGNOSIS — B370 Candidal stomatitis: Secondary | ICD-10-CM | POA: Diagnosis not present

## 2017-04-28 DIAGNOSIS — M654 Radial styloid tenosynovitis [de Quervain]: Secondary | ICD-10-CM

## 2017-04-28 MED ORDER — DICLOFENAC SODIUM 1 % TD GEL: 2.0000 g | Freq: Four times a day (QID) | TRANSDERMAL | 3 refills | Status: DC

## 2017-04-28 MED ORDER — NYSTATIN 100000 UNIT/ML MT SUSP: 5.0000 mL | Freq: Four times a day (QID) | OROMUCOSAL | 0 refills | Status: DC

## 2017-04-28 NOTE — Progress Notes (Signed)
Pre visit review using our clinic review tool, if applicable. No additional management support is needed unless otherwise documented below in the visit note. 

## 2017-04-28 NOTE — Telephone Encounter (Signed)
PA completed through Cover My Meds - approved  Pharmacy notified for filling RX.

## 2017-04-28 NOTE — Patient Instructions (Signed)
Follow up as needed/scheduled Use the Diclofenac gel on your wrist as directed Ice for pain relief We'll call you with your Ortho appt for the wrist pain Use the Nystatin and swish and spit as directed for the white spots Call [facility] and schedule an appointment to discuss your hormonal changes Call with any questions or concerns Hang in there!!!

## 2017-04-28 NOTE — Progress Notes (Signed)
   Subjective:    Patient ID: Kayla Meza, female    DOB: 1981-10-25, 36 y.o.   MRN: [id number]  HPI Wrist pain- R sided.  In January was having problems w/ thumb and pain would radiate into lower forearm.  Pt reports pain is worsening and at times having difficulty using R arm.  R hand dominant.  No known injury.    White spots- pt reports she called last week b/c she had a white coating on tongue and then areas on R and L lower jaw and roof of mouth.  Not painful.  + recent abx use after dental surgery ([facility])  Dysmenorrhea- pt reports she is unable to get out of bed 'the whole week'.  + fatigue, depression, humming noise in R ear- all resolved w/ resolution of period.  GYN- Teryl Lucy, [facility]   Review of Systems For ROS see HPI     Objective:   Physical Exam  Constitutional: She is oriented to person, place, and time. She appears well-developed and well-nourished. No distress.  Morbidly obese  HENT:  Head: Normocephalic and atraumatic.  White coating on tongue but mouth is otherwise clear.  Sutures from recent oral surgery are in place.  Neck: Normal range of motion. Neck supple.  Musculoskeletal: She exhibits tenderness (TTP over R wrist in snuffbox and over radial styloid). She exhibits no edema.  Pain w/ R wrist flexion and ulnar deviation  Lymphadenopathy:    She has no cervical adenopathy.  Neurological: She is alert and oriented to person, place, and time.  Skin: Skin is warm and dry. No rash noted. No erythema.  Psychiatric:  Flat affect, withdrawn  Vitals reviewed.         Assessment & Plan:  R wrist pain- consistent w/ DeQuervain's.  Since pt has difficulty w/ NSAIDs, will start topical Voltaren gel.  Refer to Ortho as pt is R handed and feels she is currently very limited.  Thrush- pt has been on multiple abx recently and now w/ white coating.  Will start Nystatin swish and spit.  Dysmenorrhea- ongoing issue for pt.  Strongly encouraged her to call and  schedule GYN appt as she seems to have component of PMDD as well.  Was previously on OCPs- unclear as to why she stopped.  Will defer management to GYN.  Pt expressed understanding and is in agreement w/ plan.

## 2017-05-02 ENCOUNTER — Encounter: Payer: Self-pay | Admitting: Family Medicine

## 2017-05-02 MED ORDER — NYSTATIN 100000 UNIT/ML MT SUSP: 5.0000 mL | Freq: Four times a day (QID) | OROMUCOSAL | 0 refills | Status: DC

## 2017-05-02 MED ORDER — FLUCONAZOLE 100 MG PO TABS: ORAL_TABLET | ORAL | 0 refills | Status: DC

## 2017-05-14 ENCOUNTER — Other Ambulatory Visit: Payer: Self-pay | Admitting: Internal Medicine

## 2017-05-14 ENCOUNTER — Ambulatory Visit (INDEPENDENT_AMBULATORY_CARE_PROVIDER_SITE_OTHER): Payer: BLUE CROSS/BLUE SHIELD | Admitting: Orthopaedic Surgery

## 2017-05-14 DIAGNOSIS — R918 Other nonspecific abnormal finding of lung field: Secondary | ICD-10-CM

## 2017-05-14 DIAGNOSIS — M654 Radial styloid tenosynovitis [de Quervain]: Secondary | ICD-10-CM | POA: Diagnosis not present

## 2017-05-14 MED ORDER — METHYLPREDNISOLONE 4 MG PO TABS: ORAL_TABLET | ORAL | 0 refills | Status: DC

## 2017-05-14 MED ORDER — DICLOFENAC SODIUM 1 % TD GEL: 2.0000 g | Freq: Four times a day (QID) | TRANSDERMAL | 3 refills | Status: DC

## 2017-05-14 NOTE — Progress Notes (Signed)
Office Visit Note   Patient: Kayla Meza           Date of Birth: 1981/11/15           MRN: [id number] Visit Date: 05/14/2017              Requested by: Midge Minium, MD [address] [place], [place] [address] PCP: Midge Minium, MD   Assessment & Plan: Visit Diagnoses:  1. De Quervain's disease (radial styloid tenosynovitis)     Plan: Her clinical exam findings are consistent with de Quervain's tenosynovitis of the right wrist. Offered her steroid injection that wrist but she declined. My only other options for her would be a topical anti-inflammatory and a steroid taper as well as a certified hand therapist seeing her. I gave her prescription for physical therapy and sent in a Medrol Dosepak as well as Voltaren gel. I will see her back in 4 to suggest she doing overall. She is not getting any better I'll recommend an injection of the steroid again.  Follow-Up Instructions: Return in about 4 weeks (around 06/11/2017).   Orders:  No orders of the defined types were placed in this encounter.  Meds ordered this encounter  Medications  . DISCONTD: methylPREDNISolone (MEDROL) 4 MG tablet    Sig: Medrol dose pack. Take as instructed    Dispense:  21 tablet    Refill:  0  . methylPREDNISolone (MEDROL) 4 MG tablet    Sig: Medrol dose pack. Take as instructed    Dispense:  21 tablet    Refill:  0  . diclofenac sodium (VOLTAREN) 1 % GEL    Sig: Apply 2 g topically 4 (four) times daily.    Dispense:  100 g    Refill:  3      Procedures: No procedures performed   Clinical Data: No additional findings.   Subjective: No chief complaint on file. The patient is someone seeing for the first time. She is referred to evaluate right wrist pain. He's been getting worse for about 6 months now. She is told by her primary care physician it may be a tendinitis along the aspect of her wrist on the radial side which is consistent with de Quervain's pieces very tight and  painful in the morning. She unfortunately cannot take anti-inflammatories due to gastric ulcers in the past. She said hydrocodone and she's had for dental work and gallbladder surgery helped but she understands we can't put her on narcotics for nice inflammation. She denies a numbness and tingling points the radial aspect wrist is the area of pain. She does a phone and her right hand constantly and as her main repetitive activity. She denies any specific trauma that she is aware of. She has tried a wrist splint said that actually made it worse  HPI  Review of Systems She currently denies any headache, chest pain, short of breath, fever, chills, nausea, vomiting.  Objective: Vital Signs: There were no vitals taken for this visit.  Physical Exam She is alert and oriented 3 and in no acute distress Ortho Exam Examination of her right hand and wrist show normal perfused hand. She has normal sensation in the median nerve distribution. She is significantly tender over the radial styloid and has a positive Finkelstein test. Specialty Comments:  No specialty comments available.  Imaging: No results found.   PMFS History: Patient Active Problem List   Diagnosis Date Noted  . De Quervain's disease (radial styloid  tenosynovitis) 05/14/2017  . Varicose vein of leg 04/14/2017  . Esophagitis 03/31/2017  . Diverticulosis 02/27/2017  . Reactive arthritis ([place]) 02/12/2017  . De Quervain's tenosynovitis, right 01/31/2017  . Overactive bladder 01/31/2017  . Morbid (severe) obesity due to excess calories ([place]) 01/20/2017  . Tonsil stone 01/15/2017  . Erythrocyte sedimentation rate (ESR) greater than or equal to 20 mm per hour 01/15/2017  . Change in stool 12/26/2016  . Pulmonary nodules 12/26/2016  . Thyroid nodule 12/26/2016  . Cervical paraspinal muscle spasm 12/18/2016  . Plantar fasciitis 11/19/2016  . Biliary dyskinesia 09/13/2016  . Palpitations 03/26/2016  . Pain in joint, pelvic region  and thigh 02/26/2016  . Fibromyalgia 02/26/2016  . Adenomatous polyp of colon 02/23/2016  . Uterine leiomyoma 02/09/2016  . Dysmenorrhea 02/08/2016  . Chronic gastritis 01/16/2016  . Primary osteoarthritis of right knee 01/16/2016  . Abnormal liver enzymes 01/16/2016  . Right-sided chest pain 01/03/2016   Past Medical History:  Diagnosis Date  . Arthritis   . Fatty liver   . Gastric ulcer   . GERD (gastroesophageal reflux disease)   . Multiple thyroid nodules 2018  . Palpitations   . Preeclampsia   . Tubular adenoma of colon 02/16/2016    Family History  Problem Relation Age of Onset  . Cancer Mother        colon  . Diabetes Father   . Multiple sclerosis Sister   . Depression Sister   . Cancer Maternal Aunt        colon  . Cancer Maternal Uncle        lung  . Cancer Maternal Grandmother   . Alcohol abuse Maternal Grandfather   . Cancer Maternal Aunt        colon    Past Surgical History:  Procedure Laterality Date  . CHOLECYSTECTOMY    . LIVER BIOPSY    . MOUTH SURGERY  2013  . TUBAL LIGATION     Social History   Occupational History  . Not on file.   Social History Main Topics  . Smoking status: Former Smoker    Packs/day: 0.25    Years: 10.00    Types: Cigarettes    Quit date: 04/08/2009  . Smokeless tobacco: Never Used  . Alcohol use No  . Drug use: No  . Sexual activity: Yes    Birth control/ protection: Surgical

## 2017-05-20 ENCOUNTER — Encounter: Payer: Self-pay | Admitting: Family Medicine

## 2017-06-04 ENCOUNTER — Telehealth: Payer: Self-pay | Admitting: Internal Medicine

## 2017-06-04 NOTE — Telephone Encounter (Signed)
Spoke with Almyra Free at [facility], requesting CPT and ICD10 codes for ct that was ordered.  These codes were given to [place].  Nothing further needed.

## 2017-06-10 ENCOUNTER — Encounter: Payer: BLUE CROSS/BLUE SHIELD | Admitting: Vascular Surgery

## 2017-06-16 ENCOUNTER — Encounter: Payer: Self-pay | Admitting: Family Medicine

## 2017-06-16 NOTE — Telephone Encounter (Signed)
I am not able to order an MRI on pt w/o appt for documentation of current physical state.  I have not seen her since May and we need to make sure we are evaluating/treating the right thing.  I am happy to refer to neuro and/or opthalmology for evaluation of her sxs.  If she is interested in finding a new physician as she mentioned during the phone call, I wish her the best.

## 2017-06-16 NOTE — Telephone Encounter (Signed)
Spoke with patient regarding symptoms. Patient reports lightheadedness x 15 months, seems to improve when she takes antibiotics. Has experienced "lines in my right eye" x 2 and right leg pain x 2-3 weeks that often make it difficult to walk. Last week experienced emotional/mood swings again. Pt reports GYN checked hormone levels, which she reports were normal. Patient is requesting a MRI, stating her insurance will cover completely. Also would like f/u on referral to neuro and opthalmology as discussed at office visit on 03/31/17. Patient denies headache or slurred speech. Advised patient she will potentially need a f/u appt prior to ordering MRI. Patient would like call back regardless, stating if we cannot order she will just go somewhere else. Please advise.

## 2017-06-16 NOTE — Telephone Encounter (Signed)
LM requesting call back to discuss symptoms.  

## 2017-06-17 ENCOUNTER — Encounter: Payer: Self-pay | Admitting: Vascular Surgery

## 2017-06-17 ENCOUNTER — Ambulatory Visit (INDEPENDENT_AMBULATORY_CARE_PROVIDER_SITE_OTHER): Payer: Managed Care, Other (non HMO) | Admitting: Orthopaedic Surgery

## 2017-06-17 ENCOUNTER — Ambulatory Visit (INDEPENDENT_AMBULATORY_CARE_PROVIDER_SITE_OTHER): Payer: BLUE CROSS/BLUE SHIELD | Admitting: Vascular Surgery

## 2017-06-17 VITALS — BP 113/81 | HR 83 | Temp 97.1°F | Resp 16 | Ht 67.0 in | Wt 280.0 lb

## 2017-06-17 DIAGNOSIS — I83893 Varicose veins of bilateral lower extremities with other complications: Secondary | ICD-10-CM | POA: Diagnosis not present

## 2017-06-17 NOTE — Progress Notes (Signed)
Subjective:     Patient ID: Kayla Meza, female   DOB: 10-Apr-1981, 36 y.o.   MRN: [id number]  HPI This 36 year old female was referred by Dr.Tabori for evaluation of bilateral varicose veins with swelling. The patient has had swelling for several years. There is no history of DVT thrombophlebitis stasis ulcers or bleeding. She has noted some "broken veins" in both legs. She has been feeling "ill" for the last year or so and has lost over 100 pounds from 390 pounds to 280 pounds. She has multiple systemic complaints. She had an ultrasound at [facility] several months ago to rule out a blood clot in the right leg and it was normal. She's had no previous treatment of her veins. She does not were elastic compression stockings or elevate her legs.  Past Medical History:  Diagnosis Date  . Arthritis   . Fatty liver   . Gastric ulcer   . GERD (gastroesophageal reflux disease)   . Multiple thyroid nodules 2018  . Palpitations   . Preeclampsia   . Tubular adenoma of colon 02/16/2016    Social History  Substance Use Topics  . Smoking status: Former Smoker    Packs/day: 0.25    Years: 10.00    Types: Cigarettes    Quit date: 04/08/2009  . Smokeless tobacco: Never Used  . Alcohol use No    Family History  Problem Relation Age of Onset  . Cancer Mother        colon  . Diabetes Father   . Multiple sclerosis Sister   . Depression Sister   . Cancer Maternal Aunt        colon  . Cancer Maternal Uncle        lung  . Cancer Maternal Grandmother   . Alcohol abuse Maternal Grandfather   . Cancer Maternal Aunt        colon    Allergies  Allergen Reactions  . Amoxicillin Anaphylaxis    Make feel tightness of throat and diarrhea and vomiting.   . Prednisone Other (See Comments)    Fast hear rate  . Azithromycin     Nausea and vomiting.      Current Outpatient Prescriptions:  .  Cholecalciferol (VITAMIN D PO), Take 5,000 capsules by mouth daily. , Disp: , Rfl:  .  Dexlansoprazole  (DEXILANT) 30 MG capsule, Take 30 mg by mouth daily., Disp: , Rfl:  .  Lactobacillus (PROBIOTIC ACIDOPHILUS PO), Take 1 Can by mouth 2 (two) times daily., Disp: , Rfl:  .  sucralfate (CARAFATE) 1 GM/10ML suspension, Take 1 g by mouth 4 (four) times daily -  with meals and at bedtime., Disp: , Rfl:  .  clindamycin (CLEOCIN) 300 MG capsule, TK ONE C PO QID TAT, Disp: , Rfl: 0 .  diclofenac sodium (VOLTAREN) 1 % GEL, Apply 2 g topically 4 (four) times daily. (Patient not taking: Reported on 06/17/2017), Disp: 100 g, Rfl: 3 .  fluconazole (DIFLUCAN) 100 MG tablet, 2 tablets on day 1 and then 1 tab on 2-7 (Patient not taking: Reported on 06/17/2017), Disp: 8 tablet, Rfl: 0 .  Hydrocodone-Acetaminophen 2.5-325 MG TABS, , Disp: , Rfl:  .  ibuprofen (ADVIL,MOTRIN) 600 MG tablet, , Disp: , Rfl: 0 .  methylPREDNISolone (MEDROL) 4 MG tablet, Medrol dose pack. Take as instructed (Patient not taking: Reported on 06/17/2017), Disp: 21 tablet, Rfl: 0 .  nystatin (MYCOSTATIN) 100000 UNIT/ML suspension, Take 5 mLs (500,000 Units total) by mouth 4 (four) times daily. Swish and spit. (  Patient not taking: Reported on 06/17/2017), Disp: 120 mL, Rfl: 0 .  PAPAYA ENZYMES PO, Take 1 capsule by mouth daily., Disp: , Rfl:  .  Polyethylene Glycol 3350 GRAN, by Does not apply route., Disp: , Rfl:   Vitals:   06/17/17 0947  BP: 113/81  Pulse: 83  Resp: 16  Temp: (!) 97.1 F (36.2 C)  Weight: 280 lb (127 kg)  Height: 5\' 7"  (1.702 m)    Body mass index is 43.85 kg/m.         Review of Systems Multiple systemic complaints including dyspnea on exertion, headaches, productive cough, dizziness, transient weakness, chills and fever.    Objective:   Physical Exam BP 113/81 (BP Location: Left Arm, Patient Position: Sitting, Cuff Size: Large)   Pulse 83   Temp (!) 97.1 F (36.2 C)   Resp 16   Ht 5\' 7"  (1.702 m)   Wt 280 lb (127 kg)   BMI 43.85 kg/m     Gen.-alert and oriented x3 in no apparent  distress-morbidly obese HEENT normal for age Lungs no rhonchi or wheezing Cardiovascular regular rhythm no murmurs carotid pulses 3+ palpable no bruits audible Abdomen soft nontender no palpable masses-morbidly obese Musculoskeletal free of  major deformities Skin clear -no rashes Neurologic normal Lower extremities 3+ femoral and dorsalis pedis pulses palpable bilaterally with 1+ edema A few small scattered spider veins noted bilaterally in the medial and lateral thigh and calf area. No bulging varicose veins noted. No hyperpigmentation or ulceration noted.  Today I performed a bedside SonoSite ultrasound exam which revealed bilateral great saphenous veins to be normal in caliber with no evidence of reflux       Assessment:     #1 bilateral chronic edema with no evidence of superficial vein reflux on bedside sono site exam Negative ultrasound for DVT within the past several months right leg Diffuse small spider veins bilaterally    Plan:     Would not recommend any intervention on her vasculature-no indication Recommended #1 elevate foot of bed 2-3 inches #2 short leg elastic compression stockings to be placed on the legs first sewing in the morning before rising #3 no further suggestions other than to return to her medical doctor for continued workup of her multiple systemic complaints

## 2017-06-18 ENCOUNTER — Ambulatory Visit (INDEPENDENT_AMBULATORY_CARE_PROVIDER_SITE_OTHER): Payer: BLUE CROSS/BLUE SHIELD | Admitting: Family Medicine

## 2017-06-18 ENCOUNTER — Other Ambulatory Visit: Payer: BLUE CROSS/BLUE SHIELD

## 2017-06-18 ENCOUNTER — Encounter: Payer: Self-pay | Admitting: Family Medicine

## 2017-06-18 VITALS — BP 110/72 | HR 73 | Temp 98.0°F | Resp 16 | Ht 67.0 in | Wt 283.0 lb

## 2017-06-18 DIAGNOSIS — H539 Unspecified visual disturbance: Secondary | ICD-10-CM

## 2017-06-18 DIAGNOSIS — R42 Dizziness and giddiness: Secondary | ICD-10-CM

## 2017-06-18 LAB — CBC WITH DIFFERENTIAL/PLATELET
BASOS ABS: 0.1 10*3/uL (ref 0.0–0.1)
Basophils Relative: 0.8 % (ref 0.0–3.0)
Eosinophils Absolute: 0.1 10*3/uL (ref 0.0–0.7)
Eosinophils Relative: 1.6 % (ref 0.0–5.0)
HCT: 36 % (ref 36.0–46.0)
Hemoglobin: 11.7 g/dL — ABNORMAL LOW (ref 12.0–15.0)
Lymphocytes Relative: 32 % (ref 12.0–46.0)
Lymphs Abs: 2.2 10*3/uL (ref 0.7–4.0)
MCHC: 32.6 g/dL (ref 30.0–36.0)
MCV: 81.7 fl (ref 78.0–100.0)
MONO ABS: 0.4 10*3/uL (ref 0.1–1.0)
MONOS PCT: 5.3 % (ref 3.0–12.0)
Neutro Abs: 4.2 10*3/uL (ref 1.4–7.7)
Neutrophils Relative %: 60.3 % (ref 43.0–77.0)
Platelets: 236 10*3/uL (ref 150.0–400.0)
RBC: 4.4 Mil/uL (ref 3.87–5.11)
RDW: 14.6 % (ref 11.5–15.5)
WBC: 7 10*3/uL (ref 4.0–10.5)

## 2017-06-18 LAB — BASIC METABOLIC PANEL
BUN: 17 mg/dL (ref 6–23)
CO2: 29 mEq/L (ref 19–32)
CREATININE: 0.57 mg/dL (ref 0.40–1.20)
Calcium: 9.2 mg/dL (ref 8.4–10.5)
Chloride: 105 mEq/L (ref 96–112)
GFR: 127.45 mL/min (ref >60.00)
Glucose, Bld: 90 mg/dL (ref 70–99)
POTASSIUM: 4.3 meq/L (ref 3.5–5.1)
Sodium: 142 mEq/L (ref 135–145)

## 2017-06-18 LAB — TSH: TSH: 1.67 u[IU]/mL (ref 0.35–4.50)

## 2017-06-18 MED ORDER — MECLIZINE HCL 25 MG PO TABS: 25.0000 mg | ORAL_TABLET | Freq: Three times a day (TID) | ORAL | 0 refills | Status: DC | PRN

## 2017-06-18 NOTE — Progress Notes (Signed)
   Subjective:    Patient ID: Kayla Meza, female    DOB: 04/06/1981, 36 y.o.   MRN: [id number]  HPI Dizziness- pt reports sxs started April 2017 for 1 month and resolved after tx w/ Bactrim.  Had a recurrence 'in the spring' and then last week had daily sxs.  She also had 'lines in my R eye' (vision) and 'pressure' in head.  sxs lasted all day on 7/2 and then were intermittent the rest of the week.  Last episode was 7/9.  Pt reports the sxs are 'like i'm going to pass out'.  This episode also had sensation of leg weakness.  Has not seen neuro or ophtho.     Review of Systems For ROS see HPI     Objective:   Physical Exam Morbidly obese AAOx3, CN's intact Gait WNL PERRL, EOMI, 4-5 beats of horizontal nystagmus when looking R TMs WNL bilaterally No sinus pressure RRR, normal S1/S2  Lungs CTAB, no increased work of breathing       Assessment & Plan:  Dizziness- new to provider, recurrent issue for pt.  Appears to episodic.  Asymptomatic today.  Only finding on PE is the 4-5 beats of horizontal nystagmus when looking R.  May be vertigo.  Refer to neuro for complete evaluation.  Encouraged increased hydration.  Meclizine given to use PRN.  Reviewed supportive care and red flags that should prompt return.  Pt expressed understanding and is in agreement w/ plan.   Visual disturbance- new.  Pt reports she had 'lines' in her R visual field last week when she had her dizziness.  May be ocular migraine vs other ophthalmologic issue.  Refer for complete evaluation.  Pt expressed understanding and is in agreement w/ plan.

## 2017-06-18 NOTE — Progress Notes (Signed)
Pre visit review using our clinic review tool, if applicable. No additional management support is needed unless otherwise documented below in the visit note. 

## 2017-06-18 NOTE — Patient Instructions (Signed)
Follow up as needed/scheduled We'll notify you of your lab results and make any changes if needed We'll call you with your neuro and ophthalmology appointments (if you don't hear from anyone in 7-10 days, please let me know!) Continue do drink plenty of fluids to prevent dizziness Use the Meclizine as needed for dizziness Call with any questions or concerns Hang in there!!!

## 2017-06-19 ENCOUNTER — Encounter: Payer: Self-pay | Admitting: General Practice

## 2017-06-19 ENCOUNTER — Encounter: Payer: Self-pay | Admitting: Family Medicine

## 2017-06-20 ENCOUNTER — Encounter: Payer: BLUE CROSS/BLUE SHIELD | Admitting: Vascular Surgery

## 2017-06-20 ENCOUNTER — Encounter (HOSPITAL_COMMUNITY): Payer: BLUE CROSS/BLUE SHIELD

## 2017-06-21 ENCOUNTER — Encounter (HOSPITAL_BASED_OUTPATIENT_CLINIC_OR_DEPARTMENT_OTHER): Payer: Self-pay | Admitting: Emergency Medicine

## 2017-06-21 ENCOUNTER — Emergency Department (HOSPITAL_BASED_OUTPATIENT_CLINIC_OR_DEPARTMENT_OTHER): Payer: BLUE CROSS/BLUE SHIELD

## 2017-06-21 ENCOUNTER — Emergency Department (HOSPITAL_BASED_OUTPATIENT_CLINIC_OR_DEPARTMENT_OTHER)
Admission: EM | Admit: 2017-06-21 | Discharge: 2017-06-21 | Disposition: A | Discharge status: Home / Self Care | Payer: BLUE CROSS/BLUE SHIELD | Attending: Emergency Medicine | Admitting: Emergency Medicine

## 2017-06-21 DIAGNOSIS — Z79899 Other long term (current) drug therapy: Secondary | ICD-10-CM | POA: Diagnosis not present

## 2017-06-21 DIAGNOSIS — R1084 Generalized abdominal pain: Secondary | ICD-10-CM

## 2017-06-21 DIAGNOSIS — R112 Nausea with vomiting, unspecified: Secondary | ICD-10-CM | POA: Diagnosis not present

## 2017-06-21 DIAGNOSIS — R197 Diarrhea, unspecified: Secondary | ICD-10-CM | POA: Insufficient documentation

## 2017-06-21 DIAGNOSIS — Z87891 Personal history of nicotine dependence: Secondary | ICD-10-CM | POA: Diagnosis not present

## 2017-06-21 LAB — URINALYSIS, ROUTINE W REFLEX MICROSCOPIC
Bilirubin Urine: NEGATIVE
Glucose, UA: NEGATIVE mg/dL
HGB URINE DIPSTICK: NEGATIVE
Ketones, ur: NEGATIVE mg/dL
Leukocytes, UA: NEGATIVE
Nitrite: NEGATIVE
PROTEIN: NEGATIVE mg/dL
SPECIFIC GRAVITY, URINE: 1.001 — AB (ref 1.005–1.030)
pH: 6.5 (ref 5.0–8.0)

## 2017-06-21 LAB — COMPREHENSIVE METABOLIC PANEL
ALT: 10 U/L — ABNORMAL LOW (ref 14–54)
AST: 14 U/L — AB (ref 15–41)
Albumin: 3.7 g/dL (ref 3.5–5.0)
Alkaline Phosphatase: 58 U/L (ref 38–126)
Anion gap: 9 (ref 5–15)
BILIRUBIN TOTAL: 0.5 mg/dL (ref 0.3–1.2)
BUN: 10 mg/dL (ref 6–20)
CO2: 25 mmol/L (ref 22–32)
Calcium: 8.7 mg/dL — ABNORMAL LOW (ref 8.9–10.3)
Chloride: 105 mmol/L (ref 101–111)
Creatinine, Ser: 0.6 mg/dL (ref 0.44–1.00)
Glucose, Bld: 95 mg/dL (ref 65–99)
POTASSIUM: 3.7 mmol/L (ref 3.5–5.1)
Sodium: 139 mmol/L (ref 135–145)
TOTAL PROTEIN: 7 g/dL (ref 6.5–8.1)

## 2017-06-21 LAB — CBC
HEMATOCRIT: 33.4 % — AB (ref 36.0–46.0)
Hemoglobin: 11.3 g/dL — ABNORMAL LOW (ref 12.0–15.0)
MCH: 27.6 pg (ref 26.0–34.0)
MCHC: 33.8 g/dL (ref 30.0–36.0)
MCV: 81.5 fL (ref 78.0–100.0)
PLATELETS: 246 10*3/uL (ref 150–400)
RBC: 4.1 MIL/uL (ref 3.87–5.11)
RDW: 14 % (ref 11.5–15.5)
WBC: 6.6 10*3/uL (ref 4.0–10.5)

## 2017-06-21 LAB — LIPASE, BLOOD: Lipase: 27 U/L (ref 11–51)

## 2017-06-21 LAB — PREGNANCY, URINE: PREG TEST UR: NEGATIVE

## 2017-06-21 MED ORDER — SODIUM CHLORIDE 0.9 % IV BOLUS (SEPSIS)
1000.0000 mL | Freq: Once | INTRAVENOUS | Status: AC
  Administered 2017-06-21: 1000 mL via INTRAVENOUS

## 2017-06-21 MED ORDER — MORPHINE SULFATE (PF) 4 MG/ML IV SOLN: 4.0000 mg | Freq: Once | INTRAVENOUS | Status: DC

## 2017-06-21 MED ORDER — ONDANSETRON HCL 4 MG/2ML IJ SOLN
4.0000 mg | Freq: Once | INTRAMUSCULAR | Status: AC
  Administered 2017-06-21: 4 mg via INTRAVENOUS
  Filled 2017-06-21: qty 2

## 2017-06-21 MED ORDER — HYDROCODONE-ACETAMINOPHEN 5-325 MG PO TABS: 1.0000 | ORAL_TABLET | ORAL | 0 refills | Status: DC | PRN

## 2017-06-21 MED ORDER — ONDANSETRON HCL 4 MG PO TABS: 4.0000 mg | ORAL_TABLET | Freq: Three times a day (TID) | ORAL | 0 refills | Status: DC | PRN

## 2017-06-21 MED ORDER — IOPAMIDOL (ISOVUE-300) INJECTION 61%
100.0000 mL | Freq: Once | INTRAVENOUS | Status: AC | PRN
  Administered 2017-06-21: 100 mL via INTRAVENOUS

## 2017-06-21 NOTE — Discharge Instructions (Signed)
Please use the pain medicine and nausea medicine to help treat her symptoms. Please schedule a follow-up with her primary care physician for further management. If any symptoms change or worsen, please return to the emergency department.

## 2017-06-21 NOTE — ED Notes (Signed)
Pt discharged to home NAD.  

## 2017-06-21 NOTE — ED Triage Notes (Signed)
Pt c/o LUQ abd pain and RLQ abd pain with radiation to RT flank; reports diarrhea x 1 week; NV started today

## 2017-06-21 NOTE — ED Provider Notes (Signed)
[facility]PT [facility] Provider Note   CSN: [id number] Arrival date & time: 06/21/17  1231     History   Chief Complaint Chief Complaint  Patient presents with  . Abdominal Pain  . Emesis    HPI Kayla Meza is a 36 y.o. female.  The history is provided by the patient and medical records.  Emesis   This is a new problem. The current episode started more than 2 days ago. The problem occurs 2 to 4 times per day. The problem has not changed since onset.The emesis has an appearance of stomach contents. There has been no fever. Associated symptoms include abdominal pain, chills and diarrhea. Pertinent negatives include no cough, no fever and no URI.  Diarrhea   This is a new problem. The current episode started more than 2 days ago. The problem occurs 2 to 4 times per day. The problem has not changed since onset.The stool consistency is described as watery. Associated symptoms include abdominal pain, vomiting and chills. Pertinent negatives include no URI and no cough.    Past Medical History:  Diagnosis Date  . Arthritis   . Fatty liver   . Gastric ulcer   . GERD (gastroesophageal reflux disease)   . Multiple thyroid nodules 2018  . Palpitations   . Preeclampsia   . Tubular adenoma of colon 02/16/2016    Patient Active Problem List   Diagnosis Date Noted  . De Quervain's disease (radial styloid tenosynovitis) 05/14/2017  . Varicose veins of bilateral lower extremities with other complications 27/25/3664  . Esophagitis 03/31/2017  . Diverticulosis 02/27/2017  . Reactive arthritis ([place]) 02/12/2017  . De Quervain's tenosynovitis, right 01/31/2017  . Overactive bladder 01/31/2017  . Morbid (severe) obesity due to excess calories ([place]) 01/20/2017  . Tonsil stone 01/15/2017  . Erythrocyte sedimentation rate (ESR) greater than or equal to 20 mm per hour 01/15/2017  . Change in stool 12/26/2016  . Pulmonary nodules 12/26/2016  . Thyroid nodule 12/26/2016  . Cervical  paraspinal muscle spasm 12/18/2016  . Plantar fasciitis 11/19/2016  . Biliary dyskinesia 09/13/2016  . Palpitations 03/26/2016  . Pain in joint, pelvic region and thigh 02/26/2016  . Fibromyalgia 02/26/2016  . Adenomatous polyp of colon 02/23/2016  . Uterine leiomyoma 02/09/2016  . Dysmenorrhea 02/08/2016  . Chronic gastritis 01/16/2016  . Primary osteoarthritis of right knee 01/16/2016  . Abnormal liver enzymes 01/16/2016  . Right-sided chest pain 01/03/2016    Past Surgical History:  Procedure Laterality Date  . CHOLECYSTECTOMY    . LIVER BIOPSY    . MOUTH SURGERY  2013  . TUBAL LIGATION      OB History    No data available       Home Medications    Prior to Admission medications   Medication Sig Start Date End Date Taking? Authorizing Provider  Cholecalciferol (VITAMIN D PO) Take 5,000 capsules by mouth daily.     [provider]  clindamycin (CLEOCIN) 300 MG capsule TK ONE C PO QID TAT 04/04/17   [provider]  Dexlansoprazole (DEXILANT) 30 MG capsule Take 30 mg by mouth daily.    [provider]  diclofenac sodium (VOLTAREN) 1 % GEL Apply 2 g topically 4 (four) times daily. 05/14/17   Mcarthur Rossetti, MD  Lactobacillus (PROBIOTIC ACIDOPHILUS PO) Take 1 Can by mouth 2 (two) times daily.    [provider]  meclizine (ANTIVERT) 25 MG tablet Take 1 tablet (25 mg total) by mouth 3 (three) times daily as  needed for dizziness. 06/18/17   Midge Minium, MD  PAPAYA ENZYMES PO Take 1 capsule by mouth daily.    [provider]  sucralfate (CARAFATE) 1 GM/10ML suspension Take 1 g by mouth 4 (four) times daily -  with meals and at bedtime.    [provider]    Family History Family History  Problem Relation Age of Onset  . Cancer Mother        colon  . Diabetes Father   . Multiple sclerosis Sister   . Depression Sister   . Cancer Maternal Aunt        colon  . Cancer Maternal Uncle        lung  . Cancer  Maternal Grandmother   . Alcohol abuse Maternal Grandfather   . Cancer Maternal Aunt        colon    Social History Social History  Substance Use Topics  . Smoking status: Former Smoker    Packs/day: 0.25    Years: 10.00    Types: Cigarettes    Quit date: 04/08/2009  . Smokeless tobacco: Never Used  . Alcohol use No     Allergies   Amoxicillin; Prednisone; and Azithromycin   Review of Systems Review of Systems  Constitutional: Positive for chills. Negative for appetite change, diaphoresis, fatigue and fever.  HENT: Negative for congestion.   Respiratory: Negative for cough, chest tightness and shortness of breath.   Cardiovascular: Negative for chest pain and palpitations.  Gastrointestinal: Positive for abdominal pain, diarrhea, nausea and vomiting. Negative for anal bleeding and constipation.  Genitourinary: Negative for frequency.  Musculoskeletal: Negative for back pain, neck pain and neck stiffness.  Skin: Negative for rash and wound.  All other systems reviewed and are negative.    Physical Exam Updated Vital Signs BP 137/87 (BP Location: Left Arm)   Pulse (!) 104   Temp 98.5 F (36.9 C) (Oral)   Resp 18   Ht '5\' 7"'$  (1.702 m)   Wt 126.9 kg (279 lb 12.2 oz)   LMP 05/26/2017   SpO2 99%   BMI 43.82 kg/m   Physical Exam  Constitutional: She is oriented to person, place, and time. She appears well-developed and well-nourished. No distress.  HENT:  Head: Normocephalic and atraumatic.  Mouth/Throat: Oropharynx is clear and moist. No oropharyngeal exudate.  Eyes: Pupils are equal, round, and reactive to light. Conjunctivae and EOM are normal.  Neck: Neck supple.  Cardiovascular: Normal heart sounds and intact distal pulses.   No murmur heard. Pulmonary/Chest: Effort normal and breath sounds normal. No respiratory distress. She has no wheezes. She exhibits no tenderness.  Abdominal: Soft. Normal appearance and bowel sounds are normal. There is tenderness in the  right upper quadrant and right lower quadrant. There is no rigidity, no rebound and no CVA tenderness.    Musculoskeletal: She exhibits no edema or tenderness.  Neurological: She is alert and oriented to person, place, and time. No sensory deficit. She exhibits normal muscle tone.  Skin: Skin is warm and dry. Capillary refill takes less than 2 seconds. No rash noted. She is not diaphoretic. No erythema.  Psychiatric: She has a normal mood and affect.  Nursing note and vitals reviewed.    ED Treatments / Results  Labs (all labs ordered are listed, but only abnormal results are displayed) Labs Reviewed  URINALYSIS, ROUTINE W REFLEX MICROSCOPIC - Abnormal; Notable for the following:       Result Value   Specific Gravity, Urine 1.001 (*)  All other components within normal limits  COMPREHENSIVE METABOLIC PANEL - Abnormal; Notable for the following:    Calcium 8.7 (*)    AST 14 (*)    ALT 10 (*)    All other components within normal limits  CBC - Abnormal; Notable for the following:    Hemoglobin 11.3 (*)    HCT 33.4 (*)    All other components within normal limits  PREGNANCY, URINE  LIPASE, BLOOD    EKG  EKG Interpretation None       Radiology Ct Abdomen Pelvis W Contrast  Result Date: 06/21/2017 CLINICAL DATA:  Left upper quadrant and right lower quadrant abdominal pain radiating to the right flank. Diarrhea for the past week. Nausea and vomiting today. Previous cholecystectomy. EXAM: CT ABDOMEN AND PELVIS WITH CONTRAST TECHNIQUE: Multidetector CT imaging of the abdomen and pelvis was performed using the standard protocol following bolus administration of intravenous contrast. CONTRAST:  15m ISOVUE-300 IOPAMIDOL (ISOVUE-300) INJECTION 61% COMPARISON:  Renal ultrasound dated 03/12/2016 and pelvic ultrasound dated 02/26/2016. FINDINGS: Lower chest: 8 mm right lower lobe nodule on image number 17 of series 3. Hepatobiliary: Surgically absent gallbladder.  Unremarkable liver.  Pancreas: Unremarkable. No pancreatic ductal dilatation or surrounding inflammatory changes. Spleen: Normal in size without focal abnormality. Adrenals/Urinary Tract: 1.8 cm upper pole left renal cyst. Normal appearing right kidney, ureters, urinary bladder and adrenal glands. No urinary tract calculi or hydronephrosis. Stomach/Bowel: Stomach is within normal limits. Appendix appears normal. No evidence of bowel wall thickening, distention, or inflammatory changes. Vascular/Lymphatic: No significant vascular findings are present. No enlarged abdominal or pelvic lymph nodes. Reproductive: Uterus and bilateral adnexa are unremarkable. Other: No abdominal wall hernia or abnormality. No abdominopelvic ascites. Musculoskeletal: Minimal lumbar and mild thoracic spine degenerative changes. IMPRESSION: 1. No acute abnormality. 2. 8 mm right lower lobe lung nodule. Non-contrast chest CT at 6-12 months is recommended. If the nodule is stable at time of repeat CT, then future CT at 18-24 months (from today's scan) is considered optional for low-risk patients, but is recommended for high-risk patients. This recommendation follows the consensus statement: Guidelines for Management of Incidental Pulmonary Nodules Detected on CT Images: From the Fleischner Society 2017; Radiology 2017; 284:228-243. Electronically Signed   By: SClaudie ReveringM.D.   On: 06/21/2017 16:27    Procedures Procedures (including critical care time)  Medications Ordered in ED Medications  morphine 4 MG/ML injection 4 mg (4 mg Intravenous Not Given 06/21/17 1454)  sodium chloride 0.9 % bolus 1,000 mL (0 mLs Intravenous Stopped 06/21/17 1733)  ondansetron (ZOFRAN) injection 4 mg (4 mg Intravenous Given 06/21/17 1451)  iopamidol (ISOVUE-300) 61 % injection 100 mL (100 mLs Intravenous Contrast Given 06/21/17 1559)     Initial Impression / Assessment and Plan / ED Course  I have reviewed the triage vital signs and the nursing notes.  Pertinent labs &  imaging results that were available during my care of the patient were reviewed by me and considered in my medical decision making (see chart for details).     LMakynzee Tiggesis a 36y.o. female With a past medical history significant for fibromyalgia, pulmonary nodules, chronic gastritis, GERD, and prior cholecystectomy who presents with abdominal pain, nausea, vomiting, and diarrhea. Patient reports that for the last several days, she has had nausea, vomiting, and diarrhea. Her PCP sent a C. diff test that has not returned. She reports continued abdominal pain in the right lower quadrant and left upper quadrant. She describes the pain as  moderate. She denies bleeding in her emesis or diarrhea. She denies recent abdominal trauma. She reports decreased oral intake due to nausea. She denies success of over-the-counter medications. She denies dysuria or hematuria.  History and exam are seen above.  On exam, patient has tenderness in the right lower quadrant and left upper quadrant. Lungs clear. No CVA tenderness. No significant lower extremity edema. No focal neurologic deficits.  Patient had laboratory testing with results above. Diagnostic laboratory testing was reassuring. No LFT elevation, electrolytes grossly reassuring, no leukocytosis, and similar anemia to prior. Lipase nonelevated. No evidence of UTI.  CT scan ordered due to right lower quadrant pain and tenderness. No evidence of appendicitis. Pulmonary nodule was demonstrated. Patient informed of this finding and she will follow up with PCP for further management.  Given nausea, vomiting, and diarrhea, suspect a viral gastroenteritis. Patient reports feeling much better after medications. Patient will be given prescription for pain medicine and nausea medicine and encouraged to follow up with PCP. Patient understood return precautions for any new or worsened symptoms or complete intolerance of PO. Patient voiced understanding of the plan of care  and return precautions. Patient discharged in good condition with improvement in symptoms.     Final Clinical Impressions(s) / ED Diagnoses   Final diagnoses:  Nausea vomiting and diarrhea  Generalized abdominal pain    New Prescriptions Discharge Medication List as of 06/21/2017  5:14 PM    START taking these medications   Details  HYDROcodone-acetaminophen (NORCO/VICODIN) 5-325 MG tablet Take 1 tablet by mouth every 4 (four) hours as needed., Starting Sat 06/21/2017, Print    ondansetron (ZOFRAN) 4 MG tablet Take 1 tablet (4 mg total) by mouth every 8 (eight) hours as needed for nausea or vomiting., Starting Sat 06/21/2017, Print        Clinical Impression: 1. Nausea vomiting and diarrhea   2. Generalized abdominal pain     Disposition: Discharge  Condition: Good  I have discussed the results, Dx and Tx plan with the pt(& family if present). He/she/they expressed understanding and agree(s) with the plan. Discharge instructions discussed at great length. Strict return precautions discussed and pt &/or family have verbalized understanding of the instructions. No further questions at time of discharge.    Discharge Medication List as of 06/21/2017  5:14 PM    START taking these medications   Details  HYDROcodone-acetaminophen (NORCO/VICODIN) 5-325 MG tablet Take 1 tablet by mouth every 4 (four) hours as needed., Starting Sat 06/21/2017, Print    ondansetron (ZOFRAN) 4 MG tablet Take 1 tablet (4 mg total) by mouth every 8 (eight) hours as needed for nausea or vomiting., Starting Sat 06/21/2017, Print        Follow Up: Midge Minium, MD [address] [place] [address] [phone]  Schedule an appointment as soon as possible for a visit    [facility] [address] [id number] mc [place] [address] [phone]  If symptoms worsen     Maryjane Benedict, Gwenyth Allegra, MD 06/21/17 2028

## 2017-06-24 ENCOUNTER — Telehealth: Payer: Self-pay | Admitting: Internal Medicine

## 2017-06-24 NOTE — Telephone Encounter (Signed)
lmtcb X1 for pt  

## 2017-06-24 NOTE — Telephone Encounter (Signed)
Pt returning call to triage

## 2017-06-24 NOTE — Telephone Encounter (Signed)
Spoke with pt, requesting CT results from Friday.  Pt had CT chest done at [facility] on [address], CT results are being faxed to office. Will await fax.

## 2017-06-25 NOTE — Telephone Encounter (Signed)
no change 06/20/17 > rec recheck 6 m then at 32 m I'm fine with her getting new provider at the 6 m f/u but should set up ov then

## 2017-06-25 NOTE — Telephone Encounter (Signed)
Pt called back, requesting CT results.   Spoke with Magda Paganini, CT has been received from [facility] and placed in MW's look-at.    MW please advise on CT results.  Thanks!

## 2017-06-26 NOTE — Telephone Encounter (Signed)
lmtcb x1 for pt. 

## 2017-06-27 ENCOUNTER — Encounter: Payer: BLUE CROSS/BLUE SHIELD | Admitting: Vascular Surgery

## 2017-06-27 ENCOUNTER — Encounter (HOSPITAL_COMMUNITY): Payer: BLUE CROSS/BLUE SHIELD

## 2017-06-27 NOTE — Telephone Encounter (Signed)
Patient is returning phone call. [phone]

## 2017-06-27 NOTE — Telephone Encounter (Signed)
Called spoke with patient to discuss MW's recs below.  Pt voiced her understanding.  Pt did have some difficulty understanding the 'apples to apples comparison' and why this would make a difference in how the images are read.  Attempted to explain this to patient.  Pt asked for a copy of the 7.13.18 CT from [facility] be sent to her - advised pt that once the report is scanned it cannot be viewed in MyChart (that platform does not have the capability).  Read patient the actual report findings to assist with her understanding.  Pt stated a second opinion is not needed at this time - she will have the repeat CT in 6 months as recommended and change providers at that time as recommended by MW.  Copy of report made and placed in outgoing mail for patient Nothing further needed; will sign off

## 2017-06-27 NOTE — Telephone Encounter (Signed)
It's not an apples to apples comparison so 2 mm is not significant change but happy to refer for  A second opinion from a surgeon (way to small to bx) so decision is follow vs remove:  Best choice is  Kayla Meza and he could see her a lot quicker than mcquaid

## 2017-06-27 NOTE — Telephone Encounter (Signed)
Called and spoke with pt and she stated that she doesn't see how there is not a change.  She stated that the CT done at [facility] ( on Friday) and then she was seen in the ER on Saturday (7/14 due to pt thinking that she had appendicitis)  She stated that that CT scan noted that the nodule at 42mm and this was 2 mm larger than the last CT.  Pt is concerned and wanted to have the CT results from [facility] released into mychart.  I advised the pt that those would have to be scanned into her chart first.  MW please advise. Thanks

## 2017-07-03 ENCOUNTER — Encounter: Payer: Self-pay | Admitting: Family Medicine

## 2017-07-07 ENCOUNTER — Encounter: Payer: Self-pay | Admitting: Family Medicine

## 2017-07-07 ENCOUNTER — Encounter: Payer: Self-pay | Admitting: General Practice

## 2017-07-08 ENCOUNTER — Telehealth: Payer: Self-pay | Admitting: Family Medicine

## 2017-07-08 NOTE — Telephone Encounter (Signed)
Patient dismissed from [facility] by Annye Asa MD , effective July 07, 2017. Dismissal letter sent out by certified / registered mail.  daj

## 2017-07-22 NOTE — Telephone Encounter (Signed)
Received signed domestic return receipt verifying delivery of certified letter on July 10 2017. Article number [phone]  YXA

## 2017-08-19 ENCOUNTER — Ambulatory Visit (INDEPENDENT_AMBULATORY_CARE_PROVIDER_SITE_OTHER): Payer: BLUE CROSS/BLUE SHIELD | Admitting: Neurology

## 2017-08-19 ENCOUNTER — Encounter: Payer: Self-pay | Admitting: Neurology

## 2017-08-19 VITALS — BP 117/78 | HR 66 | Ht 67.0 in | Wt 281.2 lb

## 2017-08-19 DIAGNOSIS — R55 Syncope and collapse: Secondary | ICD-10-CM | POA: Diagnosis not present

## 2017-08-19 DIAGNOSIS — H5461 Unqualified visual loss, right eye, normal vision left eye: Secondary | ICD-10-CM

## 2017-08-19 DIAGNOSIS — M5412 Radiculopathy, cervical region: Secondary | ICD-10-CM | POA: Diagnosis not present

## 2017-08-19 DIAGNOSIS — R42 Dizziness and giddiness: Secondary | ICD-10-CM

## 2017-08-19 DIAGNOSIS — R29898 Other symptoms and signs involving the musculoskeletal system: Secondary | ICD-10-CM

## 2017-08-19 DIAGNOSIS — R51 Headache: Secondary | ICD-10-CM | POA: Diagnosis not present

## 2017-08-19 DIAGNOSIS — R5383 Other fatigue: Secondary | ICD-10-CM

## 2017-08-19 DIAGNOSIS — R519 Headache, unspecified: Secondary | ICD-10-CM

## 2017-08-19 DIAGNOSIS — R200 Anesthesia of skin: Secondary | ICD-10-CM

## 2017-08-19 DIAGNOSIS — M542 Cervicalgia: Secondary | ICD-10-CM

## 2017-08-19 DIAGNOSIS — E538 Deficiency of other specified B group vitamins: Secondary | ICD-10-CM | POA: Diagnosis not present

## 2017-08-19 DIAGNOSIS — G35 Multiple sclerosis: Secondary | ICD-10-CM | POA: Diagnosis not present

## 2017-08-19 DIAGNOSIS — R202 Paresthesia of skin: Secondary | ICD-10-CM | POA: Diagnosis not present

## 2017-08-19 NOTE — Patient Instructions (Signed)
Remember to drink plenty of fluid, eat healthy meals and do not skip any meals. Try to eat protein with a every meal and eat a healthy snack such as fruit or nuts in between meals. Try to keep a regular sleep-wake schedule and try to exercise daily, particularly in the form of walking, 20-30 minutes a day, if you can.   As far as your medications are concerned, I would like to suggest  As far as diagnostic testing: MRI brain and cervical spine, labs  I would like to see you back in 3 months, sooner if we need to. Please call us with any interim questions, concerns, problems, updates or refill requests.   Our phone number is [phone]. We also have an after hours call service for urgent matters and there is a physician on-call for urgent questions. For any emergencies you know to call 911 or go to the nearest emergency room

## 2017-08-19 NOTE — Progress Notes (Signed)
[facility]    Provider:  Dr Jaynee Eagles Referring Provider: Midge Minium, MD Primary Care Physician:  Midge Minium, MD  CC:  Lightheadedness (not dizziness) feels like she is going to pass out  HPI:  Kayla Meza is a 36 y.o. female here as a referral from Dr. Birdie Riddle for dizziness. No Fhx or personal hx of migraines. Past medical history of arthritis, thyroid nodule, ulcer disease, normal biopsy of liver, diverticulosis, Fibromyalgia, preeclampsia, fatty liver, palpitations.  Symptoms started Oct 28th of 2016. Her heart was pounding, her extremities got numb, her blood pressure was elevated significantly. She went to the ED and workup was normal despite continued symptoms and was discharged. She also felt flushed, chest pain, nausea. Liver enzymes had been elevated and she had gallbladder disease, in January started getting white coat on her tongue and she has been to various doctors. Antibiotics help the dizziness. Every 3-4 months, recurrent starts with pain in the right knee, constipation, joint pain everywhere for 2 months, then sinus issues, then spots in the right eye and lightheadedness, not dizziness, not vertigo, lightheadedness, pressure on the left side of the head, spots in the right eye and feeling like she is going to pass out. She denies headache, she reports pressure like a tight feeling, very bright lights will bother her, no phonophobia, she does get nausea. Sister with MS. Unknown triggers. She has been to 24 doctors in the last several years and has been told there is nothing wrong and has been told she needs anti-depressants. She feels brushed off. Tingling,weakness of right hand and toes right side. Back pain as well. She has shooting pin into her arm and neck pain. She has frequent awakenings, unknown snoring (she will ask husband), fatigued all day, no morning headaches or dry mouth.  CMP with abnormal AT (14), ALT(10), CBC with anemia 11.3/33.4, TSH  nml, crp,sed,ccp, RF, ANA, CK, RF all normal, lyme and extensive antibody testing negative (03/2016) except EBV   B12 363 08/2016 low normal.  Reviewed notes, labs and imaging from outside physicians, which showed: Reviewed primary care notes. Patient reported fatigue, abdominal, body pain. She developed severe constipation. She has been evaluated by multiple specialists including GI, urology and reports she has been to about 20 doctors in the past 1-2 years. She developed an upper respiratory infection April 2018. Reported white coating the tongue in April of this year diagnosed with bronchitis. In April patient was also concerned about a deficiency that causes her to be prone to infection. She has also reported increased emotions, crying and stress, palpitations and increased heart rate 120s to 130s and difficulty swallowing her pills. She has been to cardiology. She was seen in the ER in April 2018 cleaning of dysphasia and peripheral edema, ER workup was unremarkable that she was diagnosed with pharyngitis. 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 SHe is also seeing vascular for her varicose veins. 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 In July of this year patient complained of lightheadedness for 15 months and improves with antibiotics also try changes and right leg pain makes it difficult to walk, emotional mood swings. She asked for referral to neurology and ophthalmology. She was seen in the emergency room with abdominal pain that month as well. Imaging of her abdomen and pelvis were stable.  Review of Systems: Patient complains of symptoms per HPI as well as the following symptoms:  Numbness, dizziness, joint pain, tingling. Pertinent negatives and positives per HPI. All others negative.   Social History   Social History  . Marital status: Married    Spouse name: N/A  . Number of children: N/A    . Years of education: N/A   Occupational History  . Not on file.   Social History Main Topics  . Smoking status: Former Smoker    Packs/day: 0.25    Years: 10.00    Types: Cigarettes    Quit date: 04/08/2009  . Smokeless tobacco: Never Used  . Alcohol use No  . Drug use: No  . Sexual activity: Yes    Birth control/ protection: Surgical   Other Topics Concern  . Not on file   Social History Narrative  . No narrative on file    Family History  Problem Relation Age of Onset  . Cancer Mother        colon  . Diabetes Father   . Multiple sclerosis Sister   . Depression Sister   . Cancer Maternal Aunt        colon  . Cancer Maternal Uncle        lung  . Cancer Maternal Grandmother   . Alcohol abuse Maternal Grandfather   . Cancer Maternal Aunt        colon    Past Medical History:  Diagnosis Date  . Arthritis   . Fatty liver   . Gastric ulcer   . GERD (gastroesophageal reflux disease)   . Multiple thyroid nodules 2018  . Palpitations   . Preeclampsia   . Tubular adenoma of colon 02/16/2016    Past Surgical History:  Procedure Laterality Date  . CHOLECYSTECTOMY    . LIVER BIOPSY    . MOUTH SURGERY  2013  . TUBAL LIGATION      Current Outpatient Prescriptions  Medication Sig Dispense Refill  . Lactobacillus (PROBIOTIC ACIDOPHILUS PO) Take 1 Can by mouth 2 (two) times daily.    Marland Kitchen PAPAYA ENZYMES PO Take 1 capsule by mouth as needed.     . sucralfate (CARAFATE) 1 GM/10ML suspension Take 1 g by mouth 4 (four) times daily -  with meals and at bedtime.    Marland Kitchen Dexlansoprazole (DEXILANT) 30 MG capsule Take 30 mg by mouth daily.     No current facility-administered medications for this visit.     Allergies as of 08/19/2017 - Review Complete 08/19/2017  Allergen Reaction Noted  . Amoxicillin Anaphylaxis 12/21/2015  . Prednisone Other (See Comments) 03/30/2017  . Azithromycin  12/26/2015    Vitals: BP 117/78 (BP Location: Right Arm, Patient Position: Sitting,  Cuff Size: Normal)   Pulse 66   Ht 5\' 7"  (1.702 m)   Wt 281 lb 3.2 oz (127.6 kg)   BMI 44.04 kg/m  Last Weight:  Wt Readings from Last 1 Encounters:  08/19/17 281 lb 3.2 oz (127.6 kg)   Last Height:   Ht Readings from Last 1 Encounters:  08/19/17 5\' 7"  (1.702 m)    Physical exam: Exam: Gen: NAD, conversant, well nourised, obese, well groomed                     CV: RRR, no MRG. No Carotid Bruits. +peripheral edema, warm, nontender Eyes: Conjunctivae clear without exudates or hemorrhage  Neuro: Detailed Neurologic Exam  Speech:    Speech is normal; fluent and spontaneous with normal comprehension.  Cognition:    The patient is oriented to person, place, and  time;     recent and remote memory intact;     language fluent;     normal attention, concentration,     fund of knowledge Cranial Nerves:    The pupils are equal, round, and reactive to light. The fundi are normal and spontaneous venous pulsations are present. Visual fields are full to finger confrontation. Extraocular movements are intact. Trigeminal sensation is intact and the muscles of mastication are normal. The face is symmetric. The palate elevates in the midline. Hearing intact. Voice is normal. Shoulder shrug is normal. The tongue has normal motion without fasciculations.   Coordination:    No dysmetria  Gait:    Not ataxic  Motor Observation:    No asymmetry, no atrophy, and no involuntary movements noted. Tone:    Normal muscle tone.    Posture:    Posture is normal. normal erect    Strength:    Patient has distal right extremity weakness, weak grip, weakness of right intrinsic hand muscles      Sensation: decreased pin prick right arm     Reflex Exam:  DTR's:    Deep tendon reflexes in the upper and lower extremities are normal bilaterally.   Toes:    Brisk withdrawal, can't evaluate for babinski.   Clonus:    Clonus is absent.      Assessment/Plan:  This is a 36 year old female with  multiple neurologic complaints including dizziness, head pressure, right eye vision loss and vision changes, right arm weakness and sensory changes, nausea, tingling and weakness of the right hand and toes on the right side. She has a family history of multiple sclerosis. Given her multiple complaints and focal findings on exam recommend MRI of the brain and cervical spine to evaluate for multiple sclerosis lesions or masses. Patient has a family history of multiple sclerosis in her sister.  MRI brain and cervical spine for evaluation of MS, or other lesions, radiculopathy or cervical myelopathy. If negative consider CTA head/neck. Needs [facility] for MRIs.  EMG/NCS on the right arm and right leg: CTS or radiculopathy vs neuropathy She has frequent awakenings, unknown snoring (she will ask husband), fatigued all day, no morning headaches or dry mouth. Can consider a sleep test future. We'll check B12, and it was low normal last checked. We'll also check renal function for MRI contrast.  Orders Placed This Encounter  Procedures  . MR BRAIN W WO CONTRAST  . MR CERVICAL SPINE W WO CONTRAST  . B12 and Folate Panel  . Methylmalonic acid, serum  . Basic Metabolic Panel  . NCV with EMG(electromyography)   Cc: Midge Minium, MD  Sarina Ill, MD  [facility] [address] [place], [place] [address]  Phone [phone] Fax [phone]

## 2017-08-20 ENCOUNTER — Telehealth: Payer: Self-pay | Admitting: *Deleted

## 2017-08-20 NOTE — Telephone Encounter (Signed)
Called and spoke with patient about normal labs per AA,MD note. She verbalized understanding.

## 2017-08-20 NOTE — Telephone Encounter (Signed)
-----   Message from Melvenia Beam, MD sent at 08/20/2017  9:03 AM EDT ----- Labs normal thanks

## 2017-08-22 LAB — B12 AND FOLATE PANEL
Folate: 11.1 ng/mL (ref >3.0)
Vitamin B-12: 472 pg/mL (ref 232–1245)

## 2017-08-22 LAB — BASIC METABOLIC PANEL
BUN/Creatinine Ratio: 20 (ref 9–23)
BUN: 12 mg/dL (ref 6–20)
CALCIUM: 8.9 mg/dL (ref 8.7–10.2)
CHLORIDE: 104 mmol/L (ref 96–106)
CO2: 25 mmol/L (ref 20–29)
Creatinine, Ser: 0.61 mg/dL (ref 0.57–1.00)
GFR calc non Af Amer: 117 mL/min/{1.73_m2} (ref >59)
GFR, EST AFRICAN AMERICAN: 135 mL/min/{1.73_m2} (ref >59)
GLUCOSE: 106 mg/dL — AB (ref 65–99)
POTASSIUM: 4.5 mmol/L (ref 3.5–5.2)
Sodium: 142 mmol/L (ref 134–144)

## 2017-08-22 LAB — METHYLMALONIC ACID, SERUM: Methylmalonic Acid: 228 nmol/L (ref 0–378)

## 2017-08-25 ENCOUNTER — Telehealth: Payer: Self-pay | Admitting: *Deleted

## 2017-08-25 NOTE — Telephone Encounter (Signed)
Labs (normal results) were called to pt on 08-20-17 by [employer], RN.

## 2017-08-25 NOTE — Telephone Encounter (Signed)
-----   Message from Melvenia Beam, MD sent at 08/23/2017 11:26 AM EDT ----- Labs normal

## 2017-08-25 NOTE — Telephone Encounter (Signed)
Labs normal and called to pt by Westley Gambles, RN on 08-20-17.

## 2017-09-03 ENCOUNTER — Encounter: Payer: Managed Care, Other (non HMO) | Admitting: Neurology

## 2017-09-11 ENCOUNTER — Encounter: Payer: Managed Care, Other (non HMO) | Admitting: Neurology

## 2017-09-25 ENCOUNTER — Ambulatory Visit (INDEPENDENT_AMBULATORY_CARE_PROVIDER_SITE_OTHER): Payer: Self-pay | Admitting: Neurology

## 2017-09-25 ENCOUNTER — Ambulatory Visit (INDEPENDENT_AMBULATORY_CARE_PROVIDER_SITE_OTHER): Payer: BLUE CROSS/BLUE SHIELD | Admitting: Neurology

## 2017-09-25 DIAGNOSIS — R202 Paresthesia of skin: Secondary | ICD-10-CM | POA: Diagnosis not present

## 2017-09-25 DIAGNOSIS — R200 Anesthesia of skin: Secondary | ICD-10-CM

## 2017-09-25 DIAGNOSIS — M5412 Radiculopathy, cervical region: Secondary | ICD-10-CM

## 2017-09-25 DIAGNOSIS — Z0289 Encounter for other administrative examinations: Secondary | ICD-10-CM

## 2017-09-25 DIAGNOSIS — G5600 Carpal tunnel syndrome, unspecified upper limb: Secondary | ICD-10-CM

## 2017-09-25 DIAGNOSIS — R29898 Other symptoms and signs involving the musculoskeletal system: Secondary | ICD-10-CM

## 2017-09-26 NOTE — Procedures (Signed)
Full Name: Kayla Meza Gender: Female MRN #: [id number] Date of Birth: [birth year]    Visit Date: 09/25/2017 11:35 Age: 36 Years 78 Months Old Examining Physician: Sarina Ill, MD  Referring Physician: Jaynee Eagles, MD  History: patient is here for evaluation of paresthesias of the limbs  Summary: The right median/ulnar (palm) comparison nerve showed prolonged distal peak latency (Median Palm, 2.4 ms, N<2.2) and abnormal peak latency difference (Median Palm-Ulnar Palm, 0.6 ms, N<0.4) with a relative median delay. The right median/ulnar (palm) comparison nerve showed prolonged distal peak latency (Median Palm, 2.2 ms, N<2.2) and abnormal peak latency difference (Median Palm-Ulnar Palm, 0.5 ms, N<0.4) with a relative median delay. All remaining nerves normal. All muscles normal.    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    Sarina Ill M.D.  [facility] [place], [place] [address] Tel: [phone] Fax: [phone]        [facility]    Nerve / Sites Muscle Latency Ref. Amplitude Ref. Rel Amp Segments Distance Velocity Ref. Area    ms ms mV mV %  cm m/s m/s mVms  R Median - APB     Wrist APB 3.8 ?4.4 8.1 ?4.0 100 Wrist - APB 7   30.3     Upper arm APB 7.3  8.1  100 Upper arm - Wrist 20 56 ?49 30.5  L Median - APB     Wrist APB 3.2 ?4.4 11.4 ?4.0 100 Wrist - APB 7   38.0     Upper arm APB 6.8  11.0  96.8 Upper arm - Wrist 20 56 ?49 35.6  R Ulnar - ADM     Wrist ADM 2.8 ?3.3 7.6 ?6.0 100 Wrist - ADM 7   30.7     B.Elbow ADM 6.1  7.5  98.6 B.Elbow - Wrist 20 60 ?49 30.3     A.Elbow ADM 7.8  7.9  105 A.Elbow - B.Elbow 10 62 ?49 31.0         A.Elbow - Wrist               SNC    Nerve / Sites Rec. Site Peak Lat Ref.  Amp Ref. Segments Distance Peak Diff Ref.    ms ms V V  cm ms ms  R Median, Ulnar - Transcarpal comparison     Median Palm Wrist 2.4 ?2.2 21 ?35 Median Palm - Wrist 8       Ulnar Palm Wrist  1.8 ?2.2 10 ?12 Ulnar Palm - Wrist 8          Median Palm - Ulnar Palm  0.6 ?0.4  L Median, Ulnar - Transcarpal comparison     Median Palm Wrist 2.2 ?2.2 32 ?35 Median Palm - Wrist 8       Ulnar Palm Wrist 1.7 ?2.2 21 ?12 Ulnar Palm - Wrist 8          Median Palm - Ulnar Palm  0.5 ?0.4  R Median - Orthodromic (Dig II, Mid palm)     Dig II Wrist 3.5 ?3.4 8 ?10 Dig II - Wrist 13    L Median - Orthodromic (Dig II, Mid palm)     Dig II Wrist 3.1 ?3.4 12 ?10 Dig II - Wrist 13    R Ulnar - Orthodromic, (Dig V, Mid palm)     Dig V Wrist 2.6 ?3.1 8 ?5 Dig V - Wrist 11  L Ulnar - Orthodromic, (Dig V, Mid palm)     Dig V Wrist 2.6 ?3.1 5 ?5 Dig V - Wrist 54                   F  Wave    Nerve F Lat Ref.   ms ms  R Ulnar - ADM 27.9 ?32.0       EMG full       EMG Summary Table    Spontaneous MUAP Recruitment  Muscle IA Fib PSW Fasc Other Amp Dur. Poly Pattern  L. Deltoid Normal None None None _______ Normal Normal Normal Normal  R. Deltoid Normal None None None _______ Normal Normal Normal Normal  L. Triceps brachii Normal None None None _______ Normal Normal Normal Normal  R. Triceps brachii Normal None None None _______ Normal Normal Normal Normal  L. Opponens pollicis Normal None None None _______ Normal Normal Normal Normal  R. Opponens pollicis Normal None None None _______ Normal Normal Normal Normal  L. Pronator teres Normal None None None _______ Normal Normal Normal Normal  R. Pronator teres Normal None None None _______ Normal Normal Normal Normal  L. First dorsal interosseous Normal None None None _______ Normal Normal Normal Normal  R. First dorsal interosseous Normal None None None _______ Normal Normal Normal Normal

## 2017-09-26 NOTE — Progress Notes (Signed)
Full Name: Kayla Meza Gender: Female MRN #: [id number] Date of Birth: 1980-12-10    Visit Date: 09/25/2017 11:35 Age: 36 Years 56 Months Old Examining Physician: Sarina Ill, MD  Referring Physician: Jaynee Eagles, MD  History: patient is here for evaluation of paresthesias of the limbs  Summary: The right median/ulnar (palm) comparison nerve showed prolonged distal peak latency (Median Palm, 2.4 ms, N<2.2) and abnormal peak latency difference (Median Palm-Ulnar Palm, 0.6 ms, N<0.4) with a relative median delay. The right median/ulnar (palm) comparison nerve showed prolonged distal peak latency (Median Palm, 2.2 ms, N<2.2) and abnormal peak latency difference (Median Palm-Ulnar Palm, 0.5 ms, N<0.4) with a relative median delay. All remaining nerves normal. All muscles normal.    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    Sarina Ill M.D.  [facility] [place], [place] [address] Tel: [phone] Fax: [phone]        [facility]    Nerve / Sites Muscle Latency Ref. Amplitude Ref. Rel Amp Segments Distance Velocity Ref. Area    ms ms mV mV %  cm m/s m/s mVms  R Median - APB     Wrist APB 3.8 ?4.4 8.1 ?4.0 100 Wrist - APB 7   30.3     Upper arm APB 7.3  8.1  100 Upper arm - Wrist 20 56 ?49 30.5  L Median - APB     Wrist APB 3.2 ?4.4 11.4 ?4.0 100 Wrist - APB 7   38.0     Upper arm APB 6.8  11.0  96.8 Upper arm - Wrist 20 56 ?49 35.6  R Ulnar - ADM     Wrist ADM 2.8 ?3.3 7.6 ?6.0 100 Wrist - ADM 7   30.7     B.Elbow ADM 6.1  7.5  98.6 B.Elbow - Wrist 20 60 ?49 30.3     A.Elbow ADM 7.8  7.9  105 A.Elbow - B.Elbow 10 62 ?49 31.0         A.Elbow - Wrist               SNC    Nerve / Sites Rec. Site Peak Lat Ref.  Amp Ref. Segments Distance Peak Diff Ref.    ms ms V V  cm ms ms  R Median, Ulnar - Transcarpal comparison     Median Palm Wrist 2.4 ?2.2 21 ?35 Median Palm - Wrist 8       Ulnar Palm Wrist  1.8 ?2.2 10 ?12 Ulnar Palm - Wrist 8          Median Palm - Ulnar Palm  0.6 ?0.4  L Median, Ulnar - Transcarpal comparison     Median Palm Wrist 2.2 ?2.2 32 ?35 Median Palm - Wrist 8       Ulnar Palm Wrist 1.7 ?2.2 21 ?12 Ulnar Palm - Wrist 8          Median Palm - Ulnar Palm  0.5 ?0.4  R Median - Orthodromic (Dig II, Mid palm)     Dig II Wrist 3.5 ?3.4 8 ?10 Dig II - Wrist 13    L Median - Orthodromic (Dig II, Mid palm)     Dig II Wrist 3.1 ?3.4 12 ?10 Dig II - Wrist 13    R Ulnar - Orthodromic, (Dig V, Mid palm)     Dig V Wrist 2.6 ?3.1 8 ?5 Dig V - Wrist 11  L Ulnar - Orthodromic, (Dig V, Mid palm)     Dig V Wrist 2.6 ?3.1 5 ?5 Dig V - Wrist 55                   F  Wave    Nerve F Lat Ref.   ms ms  R Ulnar - ADM 27.9 ?32.0       EMG full       EMG Summary Table    Spontaneous MUAP Recruitment  Muscle IA Fib PSW Fasc Other Amp Dur. Poly Pattern  L. Deltoid Normal None None None _______ Normal Normal Normal Normal  R. Deltoid Normal None None None _______ Normal Normal Normal Normal  L. Triceps brachii Normal None None None _______ Normal Normal Normal Normal  R. Triceps brachii Normal None None None _______ Normal Normal Normal Normal  L. Opponens pollicis Normal None None None _______ Normal Normal Normal Normal  R. Opponens pollicis Normal None None None _______ Normal Normal Normal Normal  L. Pronator teres Normal None None None _______ Normal Normal Normal Normal  R. Pronator teres Normal None None None _______ Normal Normal Normal Normal  L. First dorsal interosseous Normal None None None _______ Normal Normal Normal Normal  R. First dorsal interosseous Normal None None None _______ Normal Normal Normal Normal

## 2017-09-26 NOTE — Progress Notes (Signed)
See procedure note.

## 2017-10-17 ENCOUNTER — Encounter: Payer: BLUE CROSS/BLUE SHIELD | Admitting: Family Medicine

## 2017-11-13 ENCOUNTER — Other Ambulatory Visit: Payer: Self-pay | Admitting: Internal Medicine

## 2017-11-13 DIAGNOSIS — R918 Other nonspecific abnormal finding of lung field: Secondary | ICD-10-CM

## 2017-11-13 DIAGNOSIS — R911 Solitary pulmonary nodule: Secondary | ICD-10-CM

## 2017-11-18 ENCOUNTER — Ambulatory Visit: Payer: Managed Care, Other (non HMO) | Admitting: Neurology

## 2017-12-30 ENCOUNTER — Other Ambulatory Visit: Payer: BLUE CROSS/BLUE SHIELD
